# Patient Record
Sex: Female | Born: 1960 | Race: White | Hispanic: No | Marital: Single
Health system: Southern US, Community
[De-identification: ages and names within clinical notes are randomized; demographics above are authoritative.]

## PROBLEM LIST (undated history)

## (undated) DIAGNOSIS — F410 Panic disorder [episodic paroxysmal anxiety] without agoraphobia: Secondary | ICD-10-CM

## (undated) DIAGNOSIS — J4530 Mild persistent asthma, uncomplicated: Secondary | ICD-10-CM

## (undated) DIAGNOSIS — E7212 Methylenetetrahydrofolate reductase deficiency: Secondary | ICD-10-CM

## (undated) DIAGNOSIS — G479 Sleep disorder, unspecified: Secondary | ICD-10-CM

## (undated) DIAGNOSIS — I10 Essential (primary) hypertension: Secondary | ICD-10-CM

## (undated) HISTORY — PX: LIPOSUCTION: SHX10

## (undated) HISTORY — DX: Mild persistent asthma, uncomplicated: J45.30

## (undated) HISTORY — PX: ANAL FISSURECTOMY: SUR608

## (undated) HISTORY — DX: Methylenetetrahydrofolate reductase deficiency: E72.12

## (undated) HISTORY — PX: BREAST REDUCTION SURGERY: SHX8

## (undated) HISTORY — DX: Essential (primary) hypertension: I10

## (undated) HISTORY — DX: Panic disorder (episodic paroxysmal anxiety): F41.0

## (undated) HISTORY — DX: Sleep disorder, unspecified: G47.9

---

## 2016-05-04 DIAGNOSIS — L239 Allergic contact dermatitis, unspecified cause: Secondary | ICD-10-CM | POA: Insufficient documentation

## 2016-05-04 DIAGNOSIS — J4530 Mild persistent asthma, uncomplicated: Secondary | ICD-10-CM | POA: Insufficient documentation

## 2016-05-04 DIAGNOSIS — Z9109 Other allergy status, other than to drugs and biological substances: Secondary | ICD-10-CM | POA: Insufficient documentation

## 2016-05-04 DIAGNOSIS — F410 Panic disorder [episodic paroxysmal anxiety] without agoraphobia: Secondary | ICD-10-CM | POA: Insufficient documentation

## 2016-05-04 DIAGNOSIS — G479 Sleep disorder, unspecified: Secondary | ICD-10-CM | POA: Insufficient documentation

## 2016-05-04 DIAGNOSIS — R002 Palpitations: Secondary | ICD-10-CM | POA: Insufficient documentation

## 2016-05-04 DIAGNOSIS — B359 Dermatophytosis, unspecified: Secondary | ICD-10-CM | POA: Insufficient documentation

## 2016-05-04 HISTORY — DX: Mild persistent asthma, uncomplicated: J45.30

## 2016-05-04 HISTORY — DX: Panic disorder (episodic paroxysmal anxiety): F41.0

## 2016-05-04 HISTORY — DX: Sleep disorder, unspecified: G47.9

## 2017-12-24 DIAGNOSIS — I10 Essential (primary) hypertension: Secondary | ICD-10-CM

## 2017-12-24 HISTORY — DX: Essential (primary) hypertension: I10

## 2018-09-08 ENCOUNTER — Ambulatory Visit (INDEPENDENT_AMBULATORY_CARE_PROVIDER_SITE_OTHER): Payer: BLUE CROSS/BLUE SHIELD

## 2018-09-08 ENCOUNTER — Ambulatory Visit (INDEPENDENT_AMBULATORY_CARE_PROVIDER_SITE_OTHER): Payer: BLUE CROSS/BLUE SHIELD | Admitting: Family Medicine

## 2018-09-08 ENCOUNTER — Encounter: Payer: Self-pay | Admitting: Family Medicine

## 2018-09-08 VITALS — BP 148/75 | HR 92 | Ht 60.0 in | Wt 202.0 lb

## 2018-09-08 DIAGNOSIS — G8929 Other chronic pain: Secondary | ICD-10-CM

## 2018-09-08 DIAGNOSIS — E7212 Methylenetetrahydrofolate reductase deficiency: Secondary | ICD-10-CM | POA: Diagnosis not present

## 2018-09-08 DIAGNOSIS — Z1589 Genetic susceptibility to other disease: Secondary | ICD-10-CM

## 2018-09-08 DIAGNOSIS — M25571 Pain in right ankle and joints of right foot: Secondary | ICD-10-CM | POA: Diagnosis not present

## 2018-09-08 DIAGNOSIS — M25561 Pain in right knee: Secondary | ICD-10-CM

## 2018-09-08 HISTORY — DX: Genetic susceptibility to other disease: Z15.89

## 2018-09-08 MED ORDER — DICLOFENAC SODIUM 1 % TD GEL: 4.0000 g | Freq: Four times a day (QID) | TRANSDERMAL | 11 refills | Status: AC

## 2018-09-08 NOTE — Progress Notes (Signed)
Subjective:    CC: right knee and ankle pain  HPI: Rever notes a several month history of right ankle and knee pain.  She had several falls and has been limping.  She is had some evaluation already.  She is been seen by a podiatrist who obtained x-rays which were reportedly negative.  She is thought to have left lateral ankle tendinitis (likely peroneal).  She has not had home exercise program or physical therapy trial yet.  She has pain is present at the lateral ankle as well as the medial knee.  She denies any giving way or weakness currently.  She denies fevers or chills vomiting or diarrhea.  She is tried some over-the-counter medications which helped a bit.  Additionally she is tried some X 39 life wave patches over-the-counter which helped a bit.  She denies clicking popping catching.  Past medical history, Surgical history, Family history not pertinant except as noted below, Social history, Allergies, and medications have been entered into the medical record, reviewed, and no changes needed.   Review of Systems: No headache, visual changes, nausea, vomiting, diarrhea, constipation, dizziness, abdominal pain, skin rash, fevers, chills, night sweats, weight loss, swollen lymph nodes, body aches, joint swelling, muscle aches, chest pain, shortness of breath, mood changes, visual or auditory hallucinations.   Objective:    Vitals:   09/08/18 1132  BP: (!) 148/75  Pulse: 92   General: Well Developed, well nourished, and in no acute distress.  Neuro/Psych: Alert and oriented x3, extra-ocular muscles intact, able to move all 4 extremities, sensation grossly intact. Skin: Warm and dry, no rashes noted.  Respiratory: Not using accessory muscles, speaking in full sentences, trachea midline.  Cardiovascular: Pulses palpable, no extremity edema. Abdomen: Does not appear distended. MSK:  Right knee normal-appearing without significant effusion or deformity. Range of motion 0-120  degrees with mild retropatellar crepitations. Tender to palpation at medial joint line nontender otherwise. Stable ligamentous exam. Mildly positive medial McMurray's test. Intact flexion and extension strength.  Right ankle normal-appearing without significant deformity. Mildly tender to palpation at the posterior aspect of the lateral malleolus along the course of the peroneal tendon. Foot and ankle motion are intact. Patient has intact strength to foot and ankle motion without significant pain  Antalgic gait present.  Lab and Radiology Results X-ray right knee images personally independently reviewed Minimal degenerative changes at the medial compartment no acute fractures present.   Await formal radiology review  Impression and Recommendations:    Assessment and Plan: 57 y.o. female with  Right knee and ankle pain.  The pain is predominantly at the medial knee and I am concerned that she may have a degenerative meniscus tear.  Fortunately she does not have significant mechanical symptoms.  Plan for trial of conservative management.  Plan for diclofenac gel compression of knee sleeve and referral to physical therapy.  Recheck in 4 to 6 weeks.  Right ankle pain: Likely peroneal tendinitis.  Patient has a history of recent falls and I am worried may have some impaired biomechanics that were likely to be corrected with some physical therapy and gait training.  Again plan for physical therapy, compressive ankle sleeve, and diclofenac gel.  Recheck in 4 to 6 weeks.  Return sooner if needed.  We will send a copy of this note to patient's integrative medicine doctor Dr. Cindy Hazy. Fax  [phone] CC: pcp as well.  Lelan Pons, PA-C  [address]  [place], [place] [address]  [phone]  [phone] (Fax)   Orders Placed This Encounter  Procedures  . DG Knee Complete 4 Views Right    Please include patellar sunrise, lateral, and weightbearing bilateral AP  and bilateral rosenberg views    Standing Status:   Future    Number of Occurrences:   1    Standing Expiration Date:   11/08/2019    Order Specific Question:   Reason for exam:    Answer:   Please include patellar sunrise, lateral, and weightbearing bilateral AP and bilateral rosenberg views    Comments:   Please include patellar sunrise, lateral, and weightbearing bilateral AP and bilateral rosenberg views    Order Specific Question:   Preferred imaging location?    Answer:   [facility]  . DG Knee 1-2 Views Left    Standing Status:   Future    Number of Occurrences:   1    Standing Expiration Date:   11/09/2019    Order Specific Question:   Reason for Exam (SYMPTOM  OR DIAGNOSIS REQUIRED)    Answer:   For use with right knee x-ray, bilateral AP and Rosenberg standing.    Order Specific Question:   Is the patient pregnant?    Answer:   No    Order Specific Question:   Preferred imaging location?    Answer:   [facility]  . Ambulatory referral to Physical Therapy    Referral Priority:   Routine    Referral Type:   Physical Medicine    Referral Reason:   Specialty Services Required    Requested Specialty:   Physical Therapy   Meds ordered this encounter  Medications  . diclofenac sodium (VOLTAREN) 1 % GEL    Sig: Apply 4 g topically 4 (four) times daily. To affected joint.    Dispense:  100 g    Refill:  11    Discussed warning signs or symptoms. Please see discharge instructions. Patient expresses understanding.

## 2018-09-08 NOTE — Patient Instructions (Addendum)
Thank you for coming in today. I recommend body helix full knee and full ankle compression sleeve.   Apply the diclofenac gel up to 4x daily for pain as needed.   Attend PT.   Recheck in 4 weeks.  Return sooner if needed.   We can go faster or be more cautious based on how you are doing.    Meniscus Tear A meniscus tear is a knee injury in which a piece of the meniscus is torn. The meniscus is a thick, rubbery, wedge-shaped cartilage in the knee. Two menisci are located in each knee. They sit between the upper bone (femur) and lower bone (tibia) that make up the knee joint. Each meniscus acts as a shock absorber for the knee. A torn meniscus is one of the most common types of knee injuries. This injury can range from mild to severe. Surgery may be needed for a severe tear. What are the causes? This injury may be caused by any squatting, twisting, or pivoting movement. Sports-related injuries are the most common cause. These often occur from:  Running and stopping suddenly.  Changing direction.  Being tackled or knocked off your feet.  As people get older, their meniscus gets thinner and weaker. In these people, tears can happen more easily, such as from climbing stairs. What increases the risk? This injury is more likely to happen to:  People who play contact sports.  Males.  People who are 55 years of age.  What are the signs or symptoms? Symptoms of this injury include:  Knee pain, especially at the side of the knee joint. You may feel pain when the injury occurs, or you may only hear a pop and feel pain later.  A feeling that your knee is clicking, catching, locking, or giving way.  Not being able to fully bend or extend your knee.  Bruising or swelling in your knee.  How is this diagnosed? This injury may be diagnosed based on your symptoms and a physical exam. The physical exam may include:  Moving your knee in different ways.  Feeling for  tenderness.  Listening for a clicking sound.  Checking if your knee locks or catches.  You may also have tests, such as:  X-rays.  MRI.  A procedure to look inside your knee with a narrow surgical telescope (arthroscopy).  You may be referred to a knee specialist (orthopedic surgeon). How is this treated? Treatment for this injury depends on the severity of the tear. Treatment for a mild tear may include:  Rest.  Medicine to reduce pain and swelling. This is usually a nonsteroidal anti-inflammatory drug (NSAID).  A knee brace or an elastic sleeve or wrap.  Using crutches or a walker to keep weight off your knee and to help you walk.  Exercises to strengthen your knee (physical therapy).  You may need surgery if you have a severe tear or if other treatments are not working. Follow these instructions at home: Managing pain and swelling  Take over-the-counter and prescription medicines only as told by your health care provider.  If directed, apply ice to the injured area: ? Put ice in a plastic bag. ? Place a towel between your skin and the bag. ? Leave the ice on for 20 minutes, 2-3 times per day.  Raise (elevate) the injured area above the level of your heart while you are sitting or lying down. Activity  Do not use the injured limb to support your body weight until your health care  provider says that you can. Use crutches or a walker as told by your health care provider.  Return to your normal activities as told by your health care provider. Ask your health care provider what activities are safe for you.  Perform range-of-motion exercises only as told by your health care provider.  Begin doing exercises to strengthen your knee and leg muscles only as told by your health care provider. After you recover, your health care provider may recommend these exercises to help prevent another injury. General instructions  Use a knee brace or elastic wrap as told by your health  care provider.  Keep all follow-up visits as told by your health care provider. This is important. Contact a health care provider if:  You have a fever.  Your knee becomes red, tender, or swollen.  Your pain medicine is not helping.  Your symptoms get worse or do not improve after 2 weeks of home care. This information is not intended to replace advice given to you by your health care provider. Make sure you discuss any questions you have with your health care provider. Document Released: 02/06/2003 Document Revised: 04/23/2016 Document Reviewed: 03/11/2015 Elsevier Interactive Patient Education  [employer].

## 2018-09-15 ENCOUNTER — Ambulatory Visit (INDEPENDENT_AMBULATORY_CARE_PROVIDER_SITE_OTHER): Payer: BLUE CROSS/BLUE SHIELD | Admitting: Physical Therapy

## 2018-09-15 ENCOUNTER — Encounter: Payer: Self-pay | Admitting: Physical Therapy

## 2018-09-15 DIAGNOSIS — M25561 Pain in right knee: Secondary | ICD-10-CM

## 2018-09-15 DIAGNOSIS — G8929 Other chronic pain: Secondary | ICD-10-CM

## 2018-09-15 DIAGNOSIS — Z9181 History of falling: Secondary | ICD-10-CM

## 2018-09-15 DIAGNOSIS — R2689 Other abnormalities of gait and mobility: Secondary | ICD-10-CM | POA: Diagnosis not present

## 2018-09-15 DIAGNOSIS — M25571 Pain in right ankle and joints of right foot: Secondary | ICD-10-CM | POA: Diagnosis not present

## 2018-09-15 NOTE — Patient Instructions (Signed)
Access Code: [email or web address]  URL: [email or web address]  Date: 09/15/2018  Prepared by: Moshe Cipro   Exercises  Straight Leg Raise with External Rotation - 10 reps - 2 sets - 2-3 sec hold - 2x daily - 7x weekly  Sidelying Hip Abduction - 2 sets - 10 reps - 2x daily - 7x weekly  Seated Ankle Eversion with Resistance - 10 reps - 2 sets - 2x daily - 7x weekly  Standing Heel Raise - 10 reps - 2 sets - 1x daily - 7x weekly  Patient Education  Trigger Point Dry Needling

## 2018-09-16 NOTE — Therapy (Signed)
[facility] [place] [address] [place], [place], [address] Phone: [phone]   Fax:  [phone]  Physical Therapy Evaluation  Patient Details  Name: Laura Duke MRN: [id number] Date of Birth: 10-Aug-1961 Referring Provider (PT): Rodolph Bong, MD   Encounter Date: 09/15/2018  PT End of Session - 09/15/18 1611    Visit Number  1    Number of Visits  12    Date for PT Re-Evaluation  10/27/18    Authorization Type  30 visit limit    PT Start Time  1445    PT Stop Time  1525    PT Time Calculation (min)  40 min    Activity Tolerance  Patient tolerated treatment well    Behavior During Therapy  [facility] for tasks assessed/performed       Past Medical History:  Diagnosis Date  . Essential hypertension 12/24/2017  . Mild persistent asthma 05/04/2016  . MTHFR mutation (HCC) 09/08/2018  . Panic disorder without agoraphobia 05/04/2016  . Sleep disorder 05/04/2016    Past Surgical History:  Procedure Laterality Date  . ANAL FISSURECTOMY    . BREAST REDUCTION SURGERY    . LIPOSUCTION      There were no vitals filed for this visit.   Subjective Assessment - 09/15/18 1449    Subjective  Pt is a 57 y/o female who presents to OPPT for 8-9 falls over past year, resulting in Rt knee and ankle pain.  Pt reports small Rt meniscus tear, and falls most consistent with rolling of ankle.  Pt attributes ankle instability to falls, as well as dealing with grief of mother passing away.    Pertinent History  HTN, panic disorder, depression    Patient Stated Goals  exercises to help with knee and strengthening ankle    Currently in Pain?  Yes    Pain Score  0-No pain   up to 6-7/10   Pain Location  Knee    Pain Orientation  Right;Medial    Pain Descriptors / Indicators  Sharp;Stabbing    Pain Type  Chronic pain;Acute pain    Pain Onset  More than a month ago    Pain Frequency  Intermittent    Aggravating Factors   walking, full extension  when lying down at night    Pain Relieving Factors  sitting    Pain Score  3    Pain Location  Ankle    Pain Orientation  Right;Lateral    Pain Descriptors / Indicators  Constant;Discomfort    Pain Type  Chronic pain;Acute pain    Pain Onset  More than a month ago    Pain Frequency  Constant    Aggravating Factors   walking, rolling it    Pain Relieving Factors  menthol cream, ibuprofen         [facility] PT Assessment - 09/15/18 1455      Assessment   Medical Diagnosis  M25.561 (ICD-10-CM) - Acute pain of right knee; M25.571 (ICD-10-CM) - Acute right ankle pain    Referring Provider (PT)  Rodolph Bong, MD    Onset Date/Surgical Date  --   1 year ago   Next MD Visit  10/12/18    Prior Therapy  n/a      Precautions   Precautions  Fall      Restrictions   Weight Bearing Restrictions  No      Balance Screen   Has the patient fallen in the past 6  months  Yes    How many times?  6-8; most recent 2 months ago    Has the patient had a decrease in activity level because of a fear of falling?   Yes    Is the patient reluctant to leave their home because of a fear of falling?   No      Home Environment   Living Environment  Private residence    Living Arrangements  Alone    Type of Home  House    Home Access  Stairs to enter    Entrance Stairs-Number of Steps  2    Entrance Stairs-Rails  Right;Left;Can reach both    Home Layout  One level      Prior Function   Level of Independence  Independent    Vocation  Full time employment    Water engineer    Leisure  events, fairs, mountains, shopping      Cognition   Overall Cognitive Status  Within Functional Limits for tasks assessed      Observation/Other Assessments   Focus on Therapeutic Outcomes (FOTO)   33 (67% limited; predicted 45% limited)      ROM / Strength   AROM / PROM / Strength  AROM;Strength;PROM      AROM   AROM Assessment Site  Knee;Ankle    Right/Left Knee  Right;Left     Right Knee Extension  0    Right Knee Flexion  122    Left Knee Extension  0    Left Knee Flexion  122    Right/Left Ankle  Right;Left    Right Ankle Dorsiflexion  2    Right Ankle Plantar Flexion  50    Right Ankle Inversion  35    Right Ankle Eversion  12    Left Ankle Dorsiflexion  3    Left Ankle Plantar Flexion  60    Left Ankle Inversion  38    Left Ankle Eversion  15      Strength   Strength Assessment Site  Hip;Knee;Ankle    Right/Left Hip  Right;Left    Right Hip Flexion  4/5    Right Hip ABduction  3/5    Left Hip Flexion  5/5    Left Hip ABduction  5/5    Right/Left Knee  Right;Left    Right Knee Flexion  5/5    Right Knee Extension  4/5   poor VMO activation   Left Knee Flexion  5/5    Left Knee Extension  5/5    Right/Left Ankle  Right;Left    Right Ankle Dorsiflexion  5/5    Right Ankle Inversion  5/5    Right Ankle Eversion  4/5    Left Ankle Dorsiflexion  5/5    Left Ankle Inversion  5/5    Left Ankle Eversion  5/5      Palpation   Palpation comment  tight peroneal tendon, trigger points noted along peroneals      Ambulation/Gait   Gait Comments  amb with Rt foot supination                Objective measurements completed on examination: See above findings.      [facility] Adult PT Treatment/Exercise - 09/15/18 1455      Exercises   Exercises  Knee/Hip;Ankle      Knee/Hip Exercises: Supine   Straight Leg Raise with External Rotation  Right;5 reps      Knee/Hip  Exercises: Sidelying   Hip ABduction  Right;5 reps      Ankle Exercises: Standing   Heel Raises  Both;5 reps    Heel Raises Limitations  focus on RLE use      Ankle Exercises: Seated   Other Seated Ankle Exercises  eversion Rt with red theraband x 5 reps             PT Education - 09/15/18 1610    Education Details  HEP    Person(s) Educated  Patient    Methods  Explanation;Demonstration;Handout    Comprehension  Verbalized understanding;Returned demonstration;Need  further instruction       PT Short Term Goals - 09/16/18 0745      PT SHORT TERM GOAL #1   Title  formal balance testing with LTGs to follow (DGI, BERG)    Status  New    Target Date  09/30/18        PT Long Term Goals - 09/16/18 0746      PT LONG TERM GOAL #1   Title  independent with HEP    Status  New    Target Date  10/28/18      PT LONG TERM GOAL #2   Title  report ability to walk > 20 min without increase in pain for improved function    Status  New    Target Date  10/28/18      PT LONG TERM GOAL #3   Title  FOTO score improved to </= 45% limited for improved function    Status  New    Target Date  10/28/18      PT LONG TERM GOAL #4   Title  RLE strength improved to at least 4+/5 all motions for improved function and stability    Status  New    Target Date  10/28/18             Plan - 09/15/18 1527    Clinical Impression Statement  Pt is a 57 y/o female who presents to OPPT for Rt knee and ankle pain following multiple falls over the past year.  Pt demonstrates decreased strength and trigger points, as well as gait abnormalities affecting functional mobility.  Pt will benefit from PT to address deficits listed.    History and Personal Factors relevant to plan of care:  depression, HTN, asthma    Clinical Presentation  Evolving    Clinical Presentation due to:  multiple falls with unknown cause    Clinical Decision Making  Moderate    Rehab Potential  Good    PT Frequency  2x / week    PT Duration  6 weeks    PT Treatment/Interventions  ADLs/Self Care Home Management;Cryotherapy;Electrical Stimulation;Ultrasound;Moist Heat;Iontophoresis 4mg /ml Dexamethasone;Gait training;Stair training;Functional mobility training;Therapeutic activities;Therapeutic exercise;Patient/family education;Balance training;Neuromuscular re-education;Manual techniques;Taping;Dry needling;Passive range of motion    PT Next Visit Plan  review HEP, DN/manual to peroneals/gastrocs,  modalities PRN, continue per POC    PT Home Exercise Plan  Access Code: [email or web address]       Patient will benefit from skilled therapeutic intervention in order to improve the following deficits and impairments:  Abnormal gait, Increased fascial restricitons, Increased muscle spasms, Pain, Decreased mobility, Decreased balance, Decreased strength, Difficulty walking  Visit Diagnosis: Chronic pain of right knee - Plan: PT plan of care cert/re-cert  Pain in right ankle and joints of right foot - Plan: PT plan of care cert/re-cert  Other abnormalities of gait and mobility - Plan: PT plan of  care cert/re-cert  History of falling - Plan: PT plan of care cert/re-cert     Problem List Patient Active Problem List   Diagnosis Date Noted  . MTHFR mutation (HCC) 09/08/2018  . Essential hypertension 12/24/2017  . Allergic contact dermatitis 05/04/2016  . Dermatophytosis 05/04/2016  . Environmental allergies 05/04/2016  . Heart palpitations 05/04/2016  . Panic disorder without agoraphobia 05/04/2016  . Mild persistent asthma 05/04/2016  . Sleep disorder 05/04/2016      Clarita Crane, PT, DPT 09/16/18 7:50 AM    [facility] [address] [place], [place], [address] Phone: [phone]   Fax:  [phone]  Name: Laura Duke MRN: [id number] Date of Birth: 03-12-1961

## 2018-09-20 ENCOUNTER — Encounter: Payer: Self-pay | Admitting: Rehabilitative and Restorative Service Providers"

## 2018-09-20 ENCOUNTER — Ambulatory Visit (INDEPENDENT_AMBULATORY_CARE_PROVIDER_SITE_OTHER): Payer: BLUE CROSS/BLUE SHIELD | Admitting: Rehabilitative and Restorative Service Providers"

## 2018-09-20 DIAGNOSIS — G8929 Other chronic pain: Secondary | ICD-10-CM

## 2018-09-20 DIAGNOSIS — R2689 Other abnormalities of gait and mobility: Secondary | ICD-10-CM

## 2018-09-20 DIAGNOSIS — Z9181 History of falling: Secondary | ICD-10-CM

## 2018-09-20 DIAGNOSIS — M25561 Pain in right knee: Secondary | ICD-10-CM

## 2018-09-20 DIAGNOSIS — M25571 Pain in right ankle and joints of right foot: Secondary | ICD-10-CM

## 2018-09-20 NOTE — Patient Instructions (Signed)

## 2018-09-20 NOTE — Therapy (Addendum)
[facility] Rehabilitation [place] [address] [place], [place], [address] Phone: [phone]   Fax:  [phone]  Physical Therapy Treatment  Patient Details  Name: Laura Duke MRN: [id number] Date of Birth: 1961/01/28 Referring Provider (PT): Rodolph Bong, MD   Encounter Date: 09/20/2018  PT End of Session - 09/20/18 1030    Visit Number  2    Number of Visits  12    Date for PT Re-Evaluation  10/27/18    Authorization Type  30 visit limit    PT Start Time  1025    PT Stop Time  1117   moist heat post treatment    PT Time Calculation (min)  52 min    Activity Tolerance  Patient tolerated treatment well       Past Medical History:  Diagnosis Date  . Essential hypertension 12/24/2017  . Mild persistent asthma 05/04/2016  . MTHFR mutation (HCC) 09/08/2018  . Panic disorder without agoraphobia 05/04/2016  . Sleep disorder 05/04/2016    Past Surgical History:  Procedure Laterality Date  . ANAL FISSURECTOMY    . BREAST REDUCTION SURGERY    . LIPOSUCTION      There were no vitals filed for this visit.  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 She borrowed a [employer] for some of the walking she had to do. She has some increased soreness today - not sure if that is from the exercises or the walking at [employer].     Currently in Pain?  Yes    Pain Score  5     Pain Location  Knee    Pain Orientation  Right;Medial    Pain Descriptors / Indicators  Sharp;Stabbing    Pain Type  Chronic pain;Acute pain    Pain Onset  More than a month ago    Pain Score  2    Pain Orientation  Right;Lateral    Pain Descriptors / Indicators  Discomfort    Pain Type  Chronic pain;Acute pain    Pain Frequency  Intermittent                       [facility] Adult PT Treatment/Exercise - 09/20/18 0001      Knee/Hip Exercises: Supine    Straight Leg Raise with External Rotation  AROM;Strengthening;Right;10 reps      Knee/Hip Exercises: Sidelying   Hip ABduction  AROM;Strengthening;Right;10 reps   leading with heel - hip extended      Moist Heat Therapy   Number Minutes Moist Heat  15 Minutes    Moist Heat Location  --   posterior calf      Manual Therapy   Manual therapy comments  pt prone    Soft tissue mobilization  Rt gastroc/soleus; peroneals     Myofascial Release  posterior calf       Ankle Exercises: Stretches   Soleus Stretch  --   painful Rt knee - unable to do soleus stretch    Gastroc Stretch  3 reps;30 seconds      Ankle Exercises: Aerobic   Nustep  L4 x 5 min (UE 7)      Ankle Exercises: Seated   Other Seated Ankle Exercises  eversion Rt with red theraband x 10 reps      Ankle Exercises: Standing   Heel Raises  Both;5 reps  Trigger Point Dry Needling - 09/20/18 1059    Consent Given?  Yes    Education Handout Provided  Yes    Muscles Treated Upper Body  --   Rt and Rt peroneals    Gastrocnemius Response  Palpable increased muscle length    Soleus Response  Palpable increased muscle length           PT Education - 09/20/18 1046    Education Details  HEP     Person(s) Educated  Patient    Methods  Explanation    Comprehension  Verbalized understanding       PT Short Term Goals - 09/16/18 0745      PT SHORT TERM GOAL #1   Title  formal balance testing with LTGs to follow (DGI, BERG)    Status  New    Target Date  09/30/18        PT Long Term Goals - 09/16/18 0746      PT LONG TERM GOAL #1   Title  independent with HEP    Status  New    Target Date  10/28/18      PT LONG TERM GOAL #2   Title  report ability to walk > 20 min without increase in pain for improved function    Status  New    Target Date  10/28/18      PT LONG TERM GOAL #3   Title  FOTO score improved to </= 45% limited for improved function    Status  New    Target Date  10/28/18      PT LONG  TERM GOAL #4   Title  RLE strength improved to at least 4+/5 all motions for improved function and stability    Status  New    Target Date  10/28/18            Plan - 09/20/18 1107    Clinical Impression Statement  Patient has increased pain and soreness today likely due to standing and walking at [employer]. Palable tightness noted through the Rt gastroc/soleus and peroneals with some decreased tightness noted following DN and manual work. She tolerated exercise and DN well.     Rehab Potential  Good    PT Frequency  2x / week    PT Duration  6 weeks    PT Treatment/Interventions  ADLs/Self Care Home Management;Cryotherapy;Electrical Stimulation;Ultrasound;Moist Heat;Iontophoresis 4mg /ml Dexamethasone;Gait training;Stair training;Functional mobility training;Therapeutic activities;Therapeutic exercise;Patient/family education;Balance training;Neuromuscular re-education;Manual techniques;Taping;Dry needling;Passive range of motion    PT Next Visit Plan  review HEP, DN/manual to peroneals/gastrocs, modalities PRN, continue per POC    PT Home Exercise Plan  Access Code: [email or web address]    Consulted and Agree with Plan of Care  Patient       Patient will benefit from skilled therapeutic intervention in order to improve the following deficits and impairments:  Abnormal gait, Increased fascial restricitons, Increased muscle spasms, Pain, Decreased mobility, Decreased balance, Decreased strength, Difficulty walking  Visit Diagnosis: Chronic pain of right knee  Pain in right ankle and joints of right foot  Other abnormalities of gait and mobility  History of falling     Problem List Patient Active Problem List   Diagnosis Date Noted  . MTHFR mutation (HCC) 09/08/2018  . Essential hypertension 12/24/2017  . Allergic contact dermatitis 05/04/2016  . Dermatophytosis 05/04/2016  . Environmental allergies 05/04/2016  . Heart palpitations 05/04/2016  . Panic disorder without  agoraphobia 05/04/2016  . Mild persistent asthma 05/04/2016  .  Sleep disorder 05/04/2016    Jonnatan Hanners Rober Minion PT, MPH  09/20/2018, 11:13 AM  [facility] [address] [place], [place], [address] Phone: [phone]   Fax:  [phone]  Name: Deanette Tullius MRN: [id number] Date of Birth: 30-Jan-1961

## 2018-09-22 ENCOUNTER — Ambulatory Visit (INDEPENDENT_AMBULATORY_CARE_PROVIDER_SITE_OTHER): Payer: BLUE CROSS/BLUE SHIELD | Admitting: Physical Therapy

## 2018-09-22 ENCOUNTER — Encounter: Payer: Self-pay | Admitting: Physical Therapy

## 2018-09-22 DIAGNOSIS — R2689 Other abnormalities of gait and mobility: Secondary | ICD-10-CM

## 2018-09-22 DIAGNOSIS — Z9181 History of falling: Secondary | ICD-10-CM | POA: Diagnosis not present

## 2018-09-22 DIAGNOSIS — G8929 Other chronic pain: Secondary | ICD-10-CM

## 2018-09-22 DIAGNOSIS — M25561 Pain in right knee: Secondary | ICD-10-CM

## 2018-09-22 DIAGNOSIS — M25571 Pain in right ankle and joints of right foot: Secondary | ICD-10-CM | POA: Diagnosis not present

## 2018-09-22 NOTE — Therapy (Signed)
[facility] Outpatient Rehabilitation [place] [address] [place] [address] [place], [place], [address] Phone: [phone]   Fax:  [phone]  Physical Therapy Treatment  Patient Details  Name: Laura Duke MRN: [id number] Date of Birth: 02/20/1961 Referring Provider (PT): Rodolph Bong, MD   Encounter Date: 09/22/2018  PT End of Session - 09/22/18 1251    Visit Number  3    Number of Visits  12    Date for PT Re-Evaluation  10/27/18    Authorization Type  30 visit limit    PT Start Time  1206    PT Stop Time  1246    PT Time Calculation (min)  40 min    Activity Tolerance  Patient tolerated treatment well    Behavior During Therapy  [facility] for tasks assessed/performed       Past Medical History:  Diagnosis Date  . Essential hypertension 12/24/2017  . Mild persistent asthma 05/04/2016  . MTHFR mutation (HCC) 09/08/2018  . Panic disorder without agoraphobia 05/04/2016  . Sleep disorder 05/04/2016    Past Surgical History:  Procedure Laterality Date  . ANAL FISSURECTOMY    . BREAST REDUCTION SURGERY    . LIPOSUCTION      There were no vitals filed for this visit.  Subjective Assessment - 09/22/18 1208    Subjective  after last session went to chiropractor then had 4 hours of no pain.  sore from dry needling but felt it was very helpful (no DN today, but wants to consider for next week)    Patient Stated Goals  exercises to help with knee and strengthening ankle    Currently in Pain?  Yes    Pain Score  0-No pain   up to 7/10 with standing   Pain Location  Knee    Pain Orientation  Right;Medial    Pain Onset  More than a month ago    Aggravating Factors   standing, walking    Pain Relieving Factors  sitting    Pain Score  0         [facility] PT Assessment - 09/22/18 1210      Assessment   Medical Diagnosis  M25.561 (ICD-10-CM) - Acute pain of right knee; M25.571 (ICD-10-CM) - Acute right ankle pain    Referring Provider (PT)  Rodolph Bong, MD    Next MD  Visit  10/12/18                   [facility] Adult PT Treatment/Exercise - 09/22/18 1210      Exercises   Exercises  Knee/Hip;Ankle      Knee/Hip Exercises: Stretches   Passive Hamstring Stretch  Right;3 reps;30 seconds    Passive Hamstring Stretch Limitations  supine with strap    ITB Stretch  Right;3 reps;30 seconds    ITB Stretch Limitations  supine with strap      Knee/Hip Exercises: Seated   Long Arc Quad  Right;2 sets;10 reps;Weights    Long Arc Quad Weight  2 lbs.    Long [employer] Limitations  with ball squeeze      Knee/Hip Exercises: Supine   Bridges  2 sets;10 reps    Straight Leg Raise with External Rotation  AROM;Strengthening;Right;10 reps;2 sets    Straight Leg Raise with External Rotation Limitations  2#      Ankle Exercises: Aerobic   Nustep  L5 x 6 min (UE 7)   PT present to discuss progress     Ankle  Exercises: Stretches   Soleus Stretch  3 reps;30 seconds   seated   Gastroc Stretch  3 reps;30 seconds      Ankle Exercises: Seated   Other Seated Ankle Exercises  ankle 4 way with red theraband x 20 reps               PT Short Term Goals - 09/16/18 0745      PT SHORT TERM GOAL #1   Title  formal balance testing with LTGs to follow (DGI, BERG)    Status  New    Target Date  09/30/18        PT Long Term Goals - 09/16/18 0746      PT LONG TERM GOAL #1   Title  independent with HEP    Status  New    Target Date  10/28/18      PT LONG TERM GOAL #2   Title  report ability to walk > 20 min without increase in pain for improved function    Status  New    Target Date  10/28/18      PT LONG TERM GOAL #3   Title  FOTO score improved to </= 45% limited for improved function    Status  New    Target Date  10/28/18      PT LONG TERM GOAL #4   Title  RLE strength improved to at least 4+/5 all motions for improved function and stability    Status  New    Target Date  10/28/18            Plan - 09/22/18 1251    Clinical  Impression Statement  Pt tolerated session well today without increase in pain.  Session focused on strengthening and stretching today.  Will continue to benefit from PT to maximize function.    Rehab Potential  Good    PT Frequency  2x / week    PT Duration  6 weeks    PT Treatment/Interventions  ADLs/Self Care Home Management;Cryotherapy;Electrical Stimulation;Ultrasound;Moist Heat;Iontophoresis 4mg /ml Dexamethasone;Gait training;Stair training;Functional mobility training;Therapeutic activities;Therapeutic exercise;Patient/family education;Balance training;Neuromuscular re-education;Manual techniques;Taping;Dry needling;Passive range of motion    PT Next Visit Plan  review HEP, DN/manual to peroneals/gastrocs, modalities PRN, continue per POC    PT Home Exercise Plan  Access Code: [email or web address]    Consulted and Agree with Plan of Care  Patient       Patient will benefit from skilled therapeutic intervention in order to improve the following deficits and impairments:  Abnormal gait, Increased fascial restricitons, Increased muscle spasms, Pain, Decreased mobility, Decreased balance, Decreased strength, Difficulty walking  Visit Diagnosis: Chronic pain of right knee  Pain in right ankle and joints of right foot  Other abnormalities of gait and mobility  History of falling     Problem List Patient Active Problem List   Diagnosis Date Noted  . MTHFR mutation (HCC) 09/08/2018  . Essential hypertension 12/24/2017  . Allergic contact dermatitis 05/04/2016  . Dermatophytosis 05/04/2016  . Environmental allergies 05/04/2016  . Heart palpitations 05/04/2016  . Panic disorder without agoraphobia 05/04/2016  . Mild persistent asthma 05/04/2016  . Sleep disorder 05/04/2016      Clarita Crane, PT, DPT 09/22/18 12:55 PM    [facility] [address] [place] [address] [place], [place], [address] Phone: [phone]   Fax:   [phone]  Name: Zain Bingman MRN: [id number] Date of Birth: 01-Jul-1961

## 2018-09-27 ENCOUNTER — Encounter: Payer: Self-pay | Admitting: Physical Therapy

## 2018-09-27 ENCOUNTER — Ambulatory Visit (INDEPENDENT_AMBULATORY_CARE_PROVIDER_SITE_OTHER): Payer: BLUE CROSS/BLUE SHIELD | Admitting: Physical Therapy

## 2018-09-27 DIAGNOSIS — G8929 Other chronic pain: Secondary | ICD-10-CM

## 2018-09-27 DIAGNOSIS — M25571 Pain in right ankle and joints of right foot: Secondary | ICD-10-CM | POA: Diagnosis not present

## 2018-09-27 DIAGNOSIS — R2689 Other abnormalities of gait and mobility: Secondary | ICD-10-CM | POA: Diagnosis not present

## 2018-09-27 DIAGNOSIS — Z9181 History of falling: Secondary | ICD-10-CM | POA: Diagnosis not present

## 2018-09-27 DIAGNOSIS — M25561 Pain in right knee: Secondary | ICD-10-CM

## 2018-09-27 NOTE — Therapy (Signed)
[facility] Outpatient Rehabilitation [place] [address] [address] [place], [place], [address] Phone: [phone]   Fax:  [phone]  Physical Therapy Treatment  Patient Details  Name: Laura Duke MRN: [id number] Date of Birth: 1961/03/16 Referring Provider (PT): Rodolph Bong, MD   Encounter Date: 09/27/2018  PT End of Session - 09/27/18 1258    Visit Number  4    Number of Visits  12    Date for PT Re-Evaluation  10/27/18    Authorization Type  30 visit limit    PT Start Time  1214   pt arrived late   PT Stop Time  1245    PT Time Calculation (min)  31 min    Activity Tolerance  Patient tolerated treatment well    Behavior During Therapy  [facility] for tasks assessed/performed       Past Medical History:  Diagnosis Date  . Essential hypertension 12/24/2017  . Mild persistent asthma 05/04/2016  . MTHFR mutation (HCC) 09/08/2018  . Panic disorder without agoraphobia 05/04/2016  . Sleep disorder 05/04/2016    Past Surgical History:  Procedure Laterality Date  . ANAL FISSURECTOMY    . BREAST REDUCTION SURGERY    . LIPOSUCTION      There were no vitals filed for this visit.  Subjective Assessment - 09/27/18 1216    Subjective  doing well; still a little sore but wants to try DN again today.    Patient Stated Goals  exercises to help with knee and strengthening ankle    Pain Score  0-No pain    Pain Onset  More than a month ago    Pain Score  0                       [facility] Adult PT Treatment/Exercise - 09/27/18 1217      Knee/Hip Exercises: Stretches   Passive Hamstring Stretch  Right;3 reps;30 seconds    Passive Hamstring Stretch Limitations  supine with strap; with knee bent      Manual Therapy   Manual therapy comments  pt prone; skilled palpation and monitoring of soft tissue during DN    Soft tissue mobilization  Rt gastroc/soleus; peroneals; Rt hamstrings      Ankle Exercises: Aerobic   Nustep  L5 x 6 min (UE 7)        Trigger Point Dry Needling - 09/27/18 1256    Consent Given?  Yes    Muscles Treated Lower Body  Gastrocnemius;Soleus;Hamstring    Hamstring Response  Twitch response elicited;Palpable increased muscle length    Gastrocnemius Response  Twitch response elicited;Palpable increased muscle length    Soleus Response  Twitch response elicited;Palpable increased muscle length   and peroneals            PT Short Term Goals - 09/16/18 0745      PT SHORT TERM GOAL #1   Title  formal balance testing with LTGs to follow (DGI, BERG)    Status  New    Target Date  09/30/18        PT Long Term Goals - 09/16/18 0746      PT LONG TERM GOAL #1   Title  independent with HEP    Status  New    Target Date  10/28/18      PT LONG TERM GOAL #2   Title  report ability to walk > 20 min without increase in pain for improved function  Status  New    Target Date  10/28/18      PT LONG TERM GOAL #3   Title  FOTO score improved to </= 45% limited for improved function    Status  New    Target Date  10/28/18      PT LONG TERM GOAL #4   Title  RLE strength improved to at least 4+/5 all motions for improved function and stability    Status  New    Target Date  10/28/18            Plan - 09/27/18 1258    Clinical Impression Statement  Pt tolerated DN and manual therapy well today with decrease in tightness following session today.  Pt will continue to benefit from PT to maximize function.  Pt arrived late to session today so will perform formal balance testing next session.    Rehab Potential  Good    PT Frequency  2x / week    PT Duration  6 weeks    PT Treatment/Interventions  ADLs/Self Care Home Management;Cryotherapy;Electrical Stimulation;Ultrasound;Moist Heat;Iontophoresis 4mg /ml Dexamethasone;Gait training;Stair training;Functional mobility training;Therapeutic activities;Therapeutic exercise;Patient/family education;Balance training;Neuromuscular re-education;Manual  techniques;Taping;Dry needling;Passive range of motion    PT Next Visit Plan  review HEP, DN/manual to peroneals/gastrocs, modalities PRN, continue per POC; perform BERG and FGA    PT Home Exercise Plan  Access Code: [email or web address]    Consulted and Agree with Plan of Care  Patient       Patient will benefit from skilled therapeutic intervention in order to improve the following deficits and impairments:  Abnormal gait, Increased fascial restricitons, Increased muscle spasms, Pain, Decreased mobility, Decreased balance, Decreased strength, Difficulty walking  Visit Diagnosis: Chronic pain of right knee  Pain in right ankle and joints of right foot  Other abnormalities of gait and mobility  History of falling     Problem List Patient Active Problem List   Diagnosis Date Noted  . MTHFR mutation (HCC) 09/08/2018  . Essential hypertension 12/24/2017  . Allergic contact dermatitis 05/04/2016  . Dermatophytosis 05/04/2016  . Environmental allergies 05/04/2016  . Heart palpitations 05/04/2016  . Panic disorder without agoraphobia 05/04/2016  . Mild persistent asthma 05/04/2016  . Sleep disorder 05/04/2016      Clarita Crane, PT, DPT 09/27/18 1:07 PM     [facility] [address] [place], [place], [address] Phone: [phone]   Fax:  [phone]  Name: Jermya Dowding MRN: [id number] Date of Birth: 08-Feb-1961

## 2018-09-29 ENCOUNTER — Ambulatory Visit (INDEPENDENT_AMBULATORY_CARE_PROVIDER_SITE_OTHER): Payer: BLUE CROSS/BLUE SHIELD | Admitting: Physical Therapy

## 2018-09-29 DIAGNOSIS — R2689 Other abnormalities of gait and mobility: Secondary | ICD-10-CM

## 2018-09-29 DIAGNOSIS — M25561 Pain in right knee: Secondary | ICD-10-CM

## 2018-09-29 DIAGNOSIS — G8929 Other chronic pain: Secondary | ICD-10-CM

## 2018-09-29 DIAGNOSIS — Z9181 History of falling: Secondary | ICD-10-CM

## 2018-09-29 DIAGNOSIS — M25571 Pain in right ankle and joints of right foot: Secondary | ICD-10-CM | POA: Diagnosis not present

## 2018-09-29 NOTE — Therapy (Signed)
[facility] Outpatient Rehabilitation [place] [address] [place], [place], [address] Phone: [phone]   Fax:  [phone]  Physical Therapy Treatment  Patient Details  Name: Laura Duke MRN: [id number] Date of Birth: 1961-10-07 Referring Provider (PT): Rodolph Bong, MD   Encounter Date: 09/29/2018  PT End of Session - 09/29/18 1458    Visit Number  5    Number of Visits  12    Date for PT Re-Evaluation  10/27/18    Authorization Type  30 visit limit    PT Start Time  1455   pt arrived late   PT Stop Time  1532    PT Time Calculation (min)  37 min    Activity Tolerance  Patient tolerated treatment well;No increased pain    Behavior During Therapy  [facility] for tasks assessed/performed       Past Medical History:  Diagnosis Date  . Essential hypertension 12/24/2017  . Mild persistent asthma 05/04/2016  . MTHFR mutation (HCC) 09/08/2018  . Panic disorder without agoraphobia 05/04/2016  . Sleep disorder 05/04/2016    Past Surgical History:  Procedure Laterality Date  . ANAL FISSURECTOMY    . BREAST REDUCTION SURGERY    . LIPOSUCTION      There were no vitals filed for this visit.  Subjective Assessment - 09/29/18 1456    Subjective  Pt reports her calf throbbed for 24 hours after DN last session. Ankle has been a lot better.  No recent falls.      Patient Stated Goals  exercises to help with knee and strengthening ankle    Currently in Pain?  Yes    Pain Score  4     Pain Location  Knee    Pain Orientation  Right;Medial    Pain Descriptors / Indicators  Sharp    Aggravating Factors   standing, dangling her leg, first thing in morning     Pain Relieving Factors  sitting, getting moving.          [facility] PT Assessment - 09/29/18 0001      Assessment   Medical Diagnosis  M25.561 (ICD-10-CM) - Acute pain of right knee; M25.571 (ICD-10-CM) - Acute right ankle pain    Referring Provider (PT)  Rodolph Bong, MD    Next MD Visit  10/12/18       Standardized Balance Assessment   Standardized Balance Assessment  Dynamic Gait Index      Dynamic Gait Index   Level Surface  Normal    Change in Gait Speed  Mild Impairment    Gait with Horizontal Head Turns  Normal    Gait with Vertical Head Turns  Normal    Gait and Pivot Turn  Mild Impairment   3 small steps to turn   Step Over Obstacle  Mild Impairment   slows speed to step over   Step Around Obstacles  Normal    Steps  Mild Impairment    Total Score  20       [facility] Adult PT Treatment/Exercise - 09/29/18 0001      Exercises   Exercises  Knee/Hip      Knee/Hip Exercises: Stretches   Passive Hamstring Stretch  Right;30 seconds;4 reps   supine with strap   Passive Hamstring Stretch Limitations  various angles to stretch lateral and medial hamstring    Gastroc Stretch  Both;2 reps;30 seconds   slant board, level 3   Soleus Stretch  Both;2 reps;30 seconds  slant board, level 2   Other Knee/Hip Stretches  supine adductor stretch x 30 sec x 2 reps (supine with strap)      Knee/Hip Exercises: Standing   Stairs  reciprocal pattern with min to no UE support x 5 steps (challenging descending 6" step)    Gait Training  160 ft around gym with cues for increased Rt heel strike and toe off; improved with cues.       Modalities   Modalities  --   pt declined     Manual Therapy   Manual Therapy  Taping    Manual therapy comments  I strip of sensitive skin rock tape applied with 20% stretch over Rt pes anserine, perpendicular strip applied on top with 50% stretch     Soft tissue mobilization  STM and IASTM with Edge tool to Rt medial hamstring and mid-distal adductors to decrease fascial tightness       Ankle Exercises: Aerobic   Nustep  L5 x 6 min (UE 8)               PT Short Term Goals - 09/29/18 1615      PT SHORT TERM GOAL #1   Title  formal balance testing with LTGs to follow (DGI, BERG)    Status  Achieved        PT Long Term Goals - 09/16/18 0746       PT LONG TERM GOAL #1   Title  independent with HEP    Status  New    Target Date  10/28/18      PT LONG TERM GOAL #2   Title  report ability to walk > 20 min without increase in pain for improved function    Status  New    Target Date  10/28/18      PT LONG TERM GOAL #3   Title  FOTO score improved to </= 45% limited for improved function    Status  New    Target Date  10/28/18      PT LONG TERM GOAL #4   Title  RLE strength improved to at least 4+/5 all motions for improved function and stability    Status  New    Target Date  10/28/18            Plan - 09/29/18 1609    Clinical Impression Statement  Pt scored 20 on DGI; minor deficits with stepping over obstacle and stairs. Pt had some tightness in her Rt adductor/ medial hamstring; decreased with manual therapy to area.  Trial of sensitive skin Rock tape applied to Rt pes anserine for pain relief.  May benefit from ionto patch to area in future.  Progressing towards goals.    Rehab Potential  Good    PT Frequency  2x / week    PT Duration  6 weeks    PT Treatment/Interventions  ADLs/Self Care Home Management;Cryotherapy;Electrical Stimulation;Ultrasound;Moist Heat;Iontophoresis 4mg /ml Dexamethasone;Gait training;Stair training;Functional mobility training;Therapeutic activities;Therapeutic exercise;Patient/family education;Balance training;Neuromuscular re-education;Manual techniques;Taping;Dry needling;Passive range of motion    PT Next Visit Plan  continue per POC.     Consulted and Agree with Plan of Care  Patient       Patient will benefit from skilled therapeutic intervention in order to improve the following deficits and impairments:  Abnormal gait, Increased fascial restricitons, Increased muscle spasms, Pain, Decreased mobility, Decreased balance, Decreased strength, Difficulty walking  Visit Diagnosis: Chronic pain of right knee  Pain in right ankle and joints of  right foot  Other abnormalities of gait and  mobility  History of falling     Problem List Patient Active Problem List   Diagnosis Date Noted  . MTHFR mutation (HCC) 09/08/2018  . Essential hypertension 12/24/2017  . Allergic contact dermatitis 05/04/2016  . Dermatophytosis 05/04/2016  . Environmental allergies 05/04/2016  . Heart palpitations 05/04/2016  . Panic disorder without agoraphobia 05/04/2016  . Mild persistent asthma 05/04/2016  . Sleep disorder 05/04/2016   Mayer Camel, PTA 09/29/18 4:18 PM  [facility] Out[facility] Rehabilitation [place] [address] [place] [address] [place], [place], [address] Phone: [phone]   Fax:  [phone]  Name: Laura Duke MRN: [id number] Date of Birth: 08/10/1961

## 2018-10-04 ENCOUNTER — Ambulatory Visit (INDEPENDENT_AMBULATORY_CARE_PROVIDER_SITE_OTHER): Payer: BLUE CROSS/BLUE SHIELD | Admitting: Physical Therapy

## 2018-10-04 DIAGNOSIS — G8929 Other chronic pain: Secondary | ICD-10-CM

## 2018-10-04 DIAGNOSIS — M25561 Pain in right knee: Secondary | ICD-10-CM

## 2018-10-04 DIAGNOSIS — Z9181 History of falling: Secondary | ICD-10-CM | POA: Diagnosis not present

## 2018-10-04 DIAGNOSIS — M25571 Pain in right ankle and joints of right foot: Secondary | ICD-10-CM | POA: Diagnosis not present

## 2018-10-04 DIAGNOSIS — R2689 Other abnormalities of gait and mobility: Secondary | ICD-10-CM | POA: Diagnosis not present

## 2018-10-04 NOTE — Therapy (Signed)
[facility] [place] [address], [place], [address] Phone: [phone]   Fax:  [phone]  Physical Therapy Treatment  Patient Details  Name: Laura Duke MRN: [id number] Date of Birth: 1961/10/22 Referring Provider (PT): Rodolph Bong, MD   Encounter Date: 10/04/2018  PT End of Session - 10/04/18 1159    Visit Number  6    Number of Visits  12    Date for PT Re-Evaluation  10/27/18    Authorization Type  30 visit limit    PT Start Time  1153   pt arrived late   PT Stop Time  1235    PT Time Calculation (min)  42 min    Activity Tolerance  Patient tolerated treatment well    Behavior During Therapy  [facility] for tasks assessed/performed       Past Medical History:  Diagnosis Date  . Essential hypertension 12/24/2017  . Mild persistent asthma 05/04/2016  . MTHFR mutation (HCC) 09/08/2018  . Panic disorder without agoraphobia 05/04/2016  . Sleep disorder 05/04/2016    Past Surgical History:  Procedure Laterality Date  . ANAL FISSURECTOMY    . BREAST REDUCTION SURGERY    . LIPOSUCTION      There were no vitals filed for this visit.  Subjective Assessment - 10/04/18 1156    Subjective  Pt reports she was putting all weight on LLE to decrease pain in Rt knee.  This has made "the tissue of my Lt heel very tender"; "Like a stone bruise".   She liked the tape on her Rt knee; it lasted 3 days and decreased the pain.     Currently in Pain?  Yes    Pain Score  6     Pain Location  Knee    Pain Orientation  Right    Pain Descriptors / Indicators  Dull;Sharp    Aggravating Factors   standing     Pain Relieving Factors  leg elevated and propped a certain way.     Multiple Pain Sites  Yes    Pain Location  Heel    Pain Orientation  Left    Pain Descriptors / Indicators  Tender    Aggravating Factors   weight bearing     Pain Relieving Factors  non weight bearing.          [facility] PT Assessment - 10/04/18 0001      Assessment   Medical Diagnosis  M25.561 (ICD-10-CM) - Acute pain of right knee; M25.571 (ICD-10-CM) - Acute right ankle pain    Referring Provider (PT)  Rodolph Bong, MD    Next MD Visit  10/12/18       [facility] Adult PT Treatment/Exercise - 10/04/18 0001      Knee/Hip Exercises: Stretches   Passive Hamstring Stretch  Right;Left;2 reps;30 seconds   supine with strap   Gastroc Stretch  Both;2 reps;30 seconds   slant board, level 3   Soleus Stretch  Both;2 reps;30 seconds   slant board, level 2   Other Knee/Hip Stretches  supine adductor stretch x 30 sec x 2 reps (supine with strap) - each leg    Trial in standing holding onto railing; preferred supine with strap.     Manual Therapy   Manual Therapy  Taping    Manual therapy comments  I strip of sensitive skin rock tape applied with 20% stretch over Rt pes anserine, perpendicular strip applied on top with 50% stretch;   reg rock  tape applied to Lt plantar fascia and perpendicular strip applied to plantar fascia    Soft tissue mobilization  STM  to Rt medial hamstring and mid-distal adductors, Rt peroneals -  to decrease fascial tightness    Myofascial Release  Rt adductor (distal to mid)      Ankle Exercises: Aerobic   Nustep  L5 x 6 min (UE 8)      Ankle Exercises: Stretches   Plantar Fascia Stretch  1 rep;30 seconds    Gastroc Stretch  --      Ankle Exercises: Seated   Other Seated Ankle Exercises  Rt ankle eversion, inversion and DF with green band x 10; Lt ankle DF with green band x 10                PT Short Term Goals - 09/29/18 1615      PT SHORT TERM GOAL #1   Title  formal balance testing with LTGs to follow (DGI, BERG)    Status  Achieved        PT Long Term Goals - 09/16/18 0746      PT LONG TERM GOAL #1   Title  independent with HEP    Status  New    Target Date  10/28/18      PT LONG TERM GOAL #2   Title  report ability to walk > 20 min without increase in pain for improved function    Status   New    Target Date  10/28/18      PT LONG TERM GOAL #3   Title  FOTO score improved to </= 45% limited for improved function    Status  New    Target Date  10/28/18      PT LONG TERM GOAL #4   Title  RLE strength improved to at least 4+/5 all motions for improved function and stability    Status  New    Target Date  10/28/18            Plan - 10/04/18 1208    Clinical Impression Statement  Pt had minor bruising in Lt adductor and hamstring where IASTM was performed;  STM performed today without Edge tool.  She had positive response to Rock tape application; reapplied to pes anserine.  She was able to tolerate increased resistance with Rt ankle exercises.  Pt making gains towards goals with each visit.     Rehab Potential  Good    PT Frequency  2x / week    PT Duration  6 weeks    PT Treatment/Interventions  ADLs/Self Care Home Management;Cryotherapy;Electrical Stimulation;Ultrasound;Moist Heat;Iontophoresis 4mg /ml Dexamethasone;Gait training;Stair training;Functional mobility training;Therapeutic activities;Therapeutic exercise;Patient/family education;Balance training;Neuromuscular re-education;Manual techniques;Taping;Dry needling;Passive range of motion    PT Next Visit Plan  con    Consulted and Agree with Plan of Care  Patient       Patient will benefit from skilled therapeutic intervention in order to improve the following deficits and impairments:  Abnormal gait, Increased fascial restricitons, Increased muscle spasms, Pain, Decreased mobility, Decreased balance, Decreased strength, Difficulty walking  Visit Diagnosis: Chronic pain of right knee  Other abnormalities of gait and mobility  History of falling  Pain in right ankle and joints of right foot     Problem List Patient Active Problem List   Diagnosis Date Noted  . MTHFR mutation (HCC) 09/08/2018  . Essential hypertension 12/24/2017  . Allergic contact dermatitis 05/04/2016  . Dermatophytosis 05/04/2016   . Environmental allergies 05/04/2016  .  Heart palpitations 05/04/2016  . Panic disorder without agoraphobia 05/04/2016  . Mild persistent asthma 05/04/2016  . Sleep disorder 05/04/2016   Mayer Camel, PTA 10/04/18 12:55 PM  [facility] [place] [address] [place], [place], [address] Phone: [phone]   Fax:  [phone]  Name: Laura Duke MRN: [id number] Date of Birth: March 04, 1961

## 2018-10-06 ENCOUNTER — Ambulatory Visit (INDEPENDENT_AMBULATORY_CARE_PROVIDER_SITE_OTHER): Payer: BLUE CROSS/BLUE SHIELD | Admitting: Physical Therapy

## 2018-10-06 DIAGNOSIS — M25571 Pain in right ankle and joints of right foot: Secondary | ICD-10-CM

## 2018-10-06 DIAGNOSIS — G8929 Other chronic pain: Secondary | ICD-10-CM

## 2018-10-06 DIAGNOSIS — Z9181 History of falling: Secondary | ICD-10-CM | POA: Diagnosis not present

## 2018-10-06 DIAGNOSIS — R2689 Other abnormalities of gait and mobility: Secondary | ICD-10-CM

## 2018-10-06 DIAGNOSIS — M25561 Pain in right knee: Secondary | ICD-10-CM

## 2018-10-06 NOTE — Therapy (Signed)
[place] [address] [place], [place], [address] Phone: [phone]   Fax:  [phone]  Physical Therapy Treatment  Patient Details  Name: Laura Duke MRN: [id number] Date of Birth: 1961-07-27 Referring Provider (PT): Gregor Hams, MD   Encounter Date: 10/06/2018  PT End of Session - 10/06/18 1457    Visit Number  7    Number of Visits  12    Date for PT Re-Evaluation  10/27/18    Authorization Type  30 visit limit    PT Start Time  5366   pt arrived late   PT Stop Time  1531    PT Time Calculation (min)  38 min    Activity Tolerance  Patient tolerated treatment well;No increased pain    Behavior During Therapy  WFL for tasks assessed/performed       Past Medical History:  Diagnosis Date  . Essential hypertension 12/24/2017  . Mild persistent asthma 05/04/2016  . MTHFR mutation ([place]) 09/08/2018  . Panic disorder without agoraphobia 05/04/2016  . Sleep disorder 05/04/2016    Past Surgical History:  Procedure Laterality Date  . ANAL FISSURECTOMY    . BREAST REDUCTION SURGERY    . LIPOSUCTION      There were no vitals filed for this visit.  Subjective Assessment - 10/06/18 1457    Subjective  Pt reports the tape on bottom of Lt foot, "worked fantastic.  the pain is down by 70%".    I rested a lot yesterday, with pillows under the knees.      Currently in Pain?  Yes    Pain Score  3     Pain Location  Heel    Pain Orientation  Left    Pain Descriptors / Indicators  Aching    Aggravating Factors   standing    Pain Relieving Factors  not standing          [facility] PT Assessment - 10/06/18 0001      Assessment   Medical Diagnosis  d    Referring Provider (PT)  Gregor Hams, MD    Next MD Visit  10/12/18      Strength   Right/Left Hip  Right    Right Hip Flexion  --   5-/5   Right Hip Extension  4+/5    Right Hip ABduction  --   5-/5   Right Knee Extension  5/5    Right Ankle Eversion  4+/5        [facility] Adult PT Treatment/Exercise - 10/06/18 0001      Exercises   Exercises  Knee/Hip;Ankle      Knee/Hip Exercises: Stretches   ITB Stretch  Right;2 reps;30 seconds   LLE 1 rep   Gastroc Stretch  Both;2 reps;60 seconds   slant board, level 3   Soleus Stretch  Both;2 reps;30 seconds   slant board, level 2   Other Knee/Hip Stretches  supine adductor stretch x 30 sec x 2 reps (supine with strap) - each leg     Other Knee/Hip Stretches  Rt fibularis muscle stretch (prior to ITB stretch), in supine x 30 sec x 2      Knee/Hip Exercises: Prone   Hip Extension  Strengthening;Right;1 set;10 reps      Manual Therapy   Manual Therapy  Taping    Manual therapy comments  I strip of sensitive skin rock tape applied with 20% stretch over Rt pes anserine, perpendicular strip applied on top  with 50% stretch;   reg rock tape applied to Lt plantar fascia and perpendicular strip applied to plantar fascia;  I strip of sensitive Rock tape applied to Rt fibularis muscle group with perpendicular strips just above lateral mallleoli     Soft tissue mobilization  STM and IASTM to Rt fibularis muscle group to decrease pain, fascial restrictions and improve mobility; STM to Rt pes anserine prior to tape application      Ankle Exercises: Aerobic   Nustep  L5 x 5 min (UE 8)               PT Short Term Goals - 09/29/18 1615      PT SHORT TERM GOAL #1   Title  formal balance testing with LTGs to follow (DGI, BERG)    Status  Achieved        PT Long Term Goals - 10/06/18 1502      PT LONG TERM GOAL #1   Title  independent with HEP    Status  On-going      PT LONG TERM GOAL #2   Title  report ability to walk > 20 min without increase in pain for improved function    Baseline  10/06/18: 10 min     Status  On-going      PT LONG TERM GOAL #3   Title  FOTO score improved to </= 45% limited for improved function    Status  On-going      PT LONG TERM GOAL #4   Title  RLE strength  improved to at least 4+/5 all motions for improved function and stability    Status  Achieved            Plan - 10/06/18 1600    Clinical Impression Statement  Pt demonstrated improved RLE strength compared to eval; has met LTG #4.  Pt has had positive response to STM and kinesiotape application; repeated today with addition for Rt fibularis group where fascial tightness was noted.  All exercises were tolerated well, without increase in pain.     Rehab Potential  Good    PT Frequency  2x / week    PT Duration  6 weeks    PT Treatment/Interventions  ADLs/Self Care Home Management;Cryotherapy;Electrical Stimulation;Ultrasound;Moist Heat;Iontophoresis 40m/ml Dexamethasone;Gait training;Stair training;Functional mobility training;Therapeutic activities;Therapeutic exercise;Patient/family education;Balance training;Neuromuscular re-education;Manual techniques;Taping;Dry needling;Passive range of motion    PT Next Visit Plan  continue progressive LE strengthening and stretching to build walking tolerance.      Consulted and Agree with Plan of Care  Patient       Patient will benefit from skilled therapeutic intervention in order to improve the following deficits and impairments:  Abnormal gait, Increased fascial restricitons, Increased muscle spasms, Pain, Decreased mobility, Decreased balance, Decreased strength, Difficulty walking  Visit Diagnosis: Chronic pain of right knee  Other abnormalities of gait and mobility  History of falling  Pain in right ankle and joints of right foot     Problem List Patient Active Problem List   Diagnosis Date Noted  . MTHFR mutation ([place] 09/08/2018  . Essential hypertension 12/24/2017  . Allergic contact dermatitis 05/04/2016  . Dermatophytosis 05/04/2016  . Environmental allergies 05/04/2016  . Heart palpitations 05/04/2016  . Panic disorder without agoraphobia 05/04/2016  . Mild persistent asthma 05/04/2016  . Sleep disorder 05/04/2016    JKerin Perna PTA 10/06/18 4:07 PM  [place] [address]Phone: [phone]  Fax:  [phone]  Name: Debarah Mccumbers MRN: [id number] Date of Birth: 04/18/1961

## 2018-10-11 ENCOUNTER — Encounter: Payer: BLUE CROSS/BLUE SHIELD | Admitting: Physical Therapy

## 2018-10-12 ENCOUNTER — Ambulatory Visit (INDEPENDENT_AMBULATORY_CARE_PROVIDER_SITE_OTHER): Payer: BLUE CROSS/BLUE SHIELD | Admitting: Family Medicine

## 2018-10-12 ENCOUNTER — Encounter: Payer: Self-pay | Admitting: Family Medicine

## 2018-10-12 VITALS — Ht 60.0 in | Wt 206.0 lb

## 2018-10-12 DIAGNOSIS — M25571 Pain in right ankle and joints of right foot: Secondary | ICD-10-CM

## 2018-10-12 DIAGNOSIS — M25561 Pain in right knee: Secondary | ICD-10-CM

## 2018-10-12 NOTE — Progress Notes (Signed)
Laura Duke is a 57 y.o. female who presents to [facility] today for follow-up right knee and ankle pain.  Patient has had considerable improvement with physical therapy and notes continued symptoms but significant improvement.  She is happy with how things are going and would like to continue physical therapy.  She notes she is feeling much better overall.    ROS:  As above  Exam:  Ht 5' (1.524 m)   Wt 206 lb (93.4 kg)   BMI 40.23 kg/m  General: Well Developed, well nourished, and in no acute distress.  Neuro/Psych: Alert and oriented x3, extra-ocular muscles intact, able to move all 4 extremities, sensation grossly intact. Skin: Warm and dry, no rashes noted.  Respiratory: Not using accessory muscles, speaking in full sentences, trachea midline.  Cardiovascular: Pulses palpable, no extremity edema. Abdomen: Does not appear distended. MSK:  Right knee: Normal-appearing no significant effusion. Mildly tender palpation medial joint line.  Normal knee motion stable ligaments exam.   Right ankle: Normal-appearing normal motion mildly tender lateral malleolus posterior tibialis tendon area.  Stable ligamentous exam intact strength.    Lab and Radiology Results No results found for this or any previous visit (from the past 72 hour(s)). No results found.     Assessment and Plan: 57 y.o. female with significant improvement in ankle and knee pain with physical therapy.  Plan to continue home exercises and physical therapy.  Recheck as needed.    No orders of the defined types were placed in this encounter.  No orders of the defined types were placed in this encounter.   Historical information moved to improve visibility of documentation.  Past Medical History:  Diagnosis Date  . Essential hypertension 12/24/2017  . Mild persistent asthma 05/04/2016  . MTHFR mutation (HCC) 09/08/2018  . Panic disorder without agoraphobia  05/04/2016  . Sleep disorder 05/04/2016   Past Surgical History:  Procedure Laterality Date  . ANAL FISSURECTOMY    . BREAST REDUCTION SURGERY    . LIPOSUCTION     Social History   Tobacco Use  . Smoking status: Unknown If Ever Smoked  . Smokeless tobacco: Never Used  Substance Use Topics  . Alcohol use: Not on file   family history is not on file.  Medications: Current Outpatient Medications  Medication Sig Dispense Refill  . Ascorbic Acid (VITAMIN C) 1000 MG tablet Take by mouth.    . B-Complex-C CAPS Take by mouth.    . budesonide-formoterol (SYMBICORT) 160-4.5 MCG/ACT inhaler Inhale into the lungs.    . Cholecalciferol (VITAMIN D3) 2000 units TABS Take by mouth.    . Coenzyme Q10 (COQ10) 100 MG CAPS Take by mouth.    . diclofenac sodium (VOLTAREN) 1 % GEL Apply 4 g topically 4 (four) times daily. To affected joint. 100 g 11  . GLUTATHIONE PO Take by mouth.    . losartan (COZAAR) 25 MG tablet Take 25 mg by mouth daily.  1  . Nutritional Supplements (DHEA PO) Take by mouth.    . Pregnenolone Micronized POWD by Does not apply route.    . Probiotic Product (PROBIOTIC DAILY PO) Take by mouth.    . Selenium 100 MCG CAPS Take by mouth.    . sertraline (ZOLOFT) 25 MG tablet Take by mouth.    . triamcinolone (NASACORT ALLERGY 24HR) 55 MCG/ACT AERO nasal inhaler Place into the nose.     No current facility-administered medications for this visit.    Allergies  Allergen Reactions  . Atenolol Shortness Of Breath  . Gluten Meal Rash and Swelling  . Lactase Swelling  . Hydrochlorothiazide Other (See Comments)  . Levofloxacin Other (See Comments)    Insomnia  . Metronidazole Nausea And Vomiting  . Morphine And Related Nausea And Vomiting  . Sertraline Other (See Comments)    Muscle Weakness  . Venlafaxine Other (See Comments) and Swelling    Joint pain  . Casein Other (See Comments) and Rash  . Penicillins Rash and Hives      Discussed warning signs or symptoms. Please see  discharge instructions. Patient expresses understanding.

## 2018-10-12 NOTE — Patient Instructions (Addendum)
Thank you for coming in today. Continue physical therapy and home exercises.   Recheck as needed.   Return sooner if needed.

## 2018-10-13 ENCOUNTER — Encounter: Payer: BLUE CROSS/BLUE SHIELD | Admitting: Physical Therapy

## 2018-10-13 ENCOUNTER — Encounter: Payer: Self-pay | Admitting: Physical Therapy

## 2018-10-13 NOTE — Therapy (Signed)
[facility] [address] [place], [place], [address] Phone: [phone]   Fax:  [phone]  Patient Details  Name: Laura OchsDeidra Duke MRN: [id number] Date of Birth: 1961/06/07 Referring Provider:  No ref. provider found  Encounter Date: 10/13/2018  I strip of sensitive skin rock tape applied with 20% stretch over Rt pes anserine, perpendicular strip applied on top with 50% stretch;  I strip of sensitive Rock tape applied to Rt fibularis muscle group with perpendicular strip just above lateral mallleoli    Tape applied by Cendant CorporationJenn Carlson-Long, PTA.  No charge for visit as pt arrived 40 min late and only requested taping.    Clarita CraneStephanie F Briscoe Daniello, PT, DPT 10/13/18 3:36 PM     [facility] [address] [place], [place], [address] Phone: [phone]   Fax:  [phone]

## 2018-10-20 ENCOUNTER — Ambulatory Visit (INDEPENDENT_AMBULATORY_CARE_PROVIDER_SITE_OTHER): Payer: BLUE CROSS/BLUE SHIELD | Admitting: Rehabilitative and Restorative Service Providers"

## 2018-10-20 ENCOUNTER — Encounter: Payer: Self-pay | Admitting: Rehabilitative and Restorative Service Providers"

## 2018-10-20 DIAGNOSIS — M25571 Pain in right ankle and joints of right foot: Secondary | ICD-10-CM | POA: Diagnosis not present

## 2018-10-20 DIAGNOSIS — G8929 Other chronic pain: Secondary | ICD-10-CM

## 2018-10-20 DIAGNOSIS — M25561 Pain in right knee: Secondary | ICD-10-CM | POA: Diagnosis not present

## 2018-10-20 DIAGNOSIS — Z9181 History of falling: Secondary | ICD-10-CM | POA: Diagnosis not present

## 2018-10-20 DIAGNOSIS — R2689 Other abnormalities of gait and mobility: Secondary | ICD-10-CM

## 2018-10-20 NOTE — Therapy (Signed)
[facility] [place] [address] [place], [place], [address] Phone: [phone]   Fax:  [phone]  Physical Therapy Treatment  Patient Details  Name: Laura Duke MRN: [id number] Date of Birth: July 21, 1961 Referring Provider (PT): Rodolph Bong, MD   Encounter Date: 10/20/2018  PT End of Session - 10/20/18 1450    Visit Number  8    Number of Visits  12    Date for PT Re-Evaluation  10/27/18    Authorization Type  30 visit limit    PT Start Time  1445    PT Stop Time  1530    PT Time Calculation (min)  45 min    Activity Tolerance  Patient tolerated treatment well       Past Medical History:  Diagnosis Date  . Essential hypertension 12/24/2017  . Mild persistent asthma 05/04/2016  . MTHFR mutation (HCC) 09/08/2018  . Panic disorder without agoraphobia 05/04/2016  . Sleep disorder 05/04/2016    Past Surgical History:  Procedure Laterality Date  . ANAL FISSURECTOMY    . BREAST REDUCTION SURGERY    . LIPOSUCTION      There were no vitals filed for this visit.  Subjective Assessment - 10/20/18 1450    Subjective  Missed PT session then forgot to do her exercises and overdid it at work - now knee is angry and "does not want to work". Started feeling better when she started doing her exercises again.     Currently in Pain?  Yes    Pain Score  3     Pain Location  Knee    Pain Orientation  Right    Pain Descriptors / Indicators  Aching    Pain Type  Chronic pain;Acute pain    Pain Onset  More than a month ago    Pain Frequency  Intermittent    Pain Score  3    Pain Location  Heel    Pain Orientation  Right;Left    Pain Descriptors / Indicators  Aching;Tender    Pain Type  Chronic pain;Acute pain    Pain Onset  More than a month ago    Pain Frequency  Intermittent                       [facility] Adult PT Treatment/Exercise - 10/20/18 0001      Knee/Hip Exercises: Stretches   Passive Hamstring Stretch   Right;Left;2 reps;30 seconds   supine with strap    ITB Stretch  Right;2 reps;30 seconds   supine with strap    Gastroc Stretch  Both;2 reps;60 seconds   slant board, level 3   Soleus Stretch  Both;2 reps;30 seconds   slant board, level 2   Other Knee/Hip Stretches  supine adductor stretch x 30 sec x 2 reps (supine with strap) - each leg     Other Knee/Hip Stretches  Rt fibularis muscle stretch (prior to ITB stretch), in supine x 30 sec x 2      Knee/Hip Exercises: Supine   Bridges  Strengthening;Both;10 reps   5 second hold    Straight Leg Raise with External Rotation  Right;10 reps;2 sets    Other Supine Knee/Hip Exercises  clam holding one LE still moving opposite alternating LE's x 10 each side x 2 sets green TB       Knee/Hip Exercises: Sidelying   Hip ABduction  Strengthening;Right;10 reps;2 sets      Manual Therapy  Manual Therapy  Taping    Manual therapy comments  I strip of sensitive skin rock tape applied with 20% stretch over Rt pes anserine, perpendicular strip applied on top with 50% stretch;   reg rock tape applied to Lt plantar fascia and perpendicular strip applied to plantar fascia;  I strip of sensitive Rock tape applied to Rt fibularis muscle group with perpendicular strips just above lateral mallleoli                PT Short Term Goals - 09/29/18 1615      PT SHORT TERM GOAL #1   Title  formal balance testing with LTGs to follow (DGI, BERG)    Status  Achieved        PT Long Term Goals - 10/06/18 1502      PT LONG TERM GOAL #1   Title  independent with HEP    Status  On-going      PT LONG TERM GOAL #2   Title  report ability to walk > 20 min without increase in pain for improved function    Baseline  10/06/18: 10 min     Status  On-going      PT LONG TERM GOAL #3   Title  FOTO score improved to </= 45% limited for improved function    Status  On-going      PT LONG TERM GOAL #4   Title  RLE strength improved to at least 4+/5 all motions  for improved function and stability    Status  Achieved            Plan - 10/20/18 1450    Clinical Impression Statement  Conmtinues up and down course with ankle and knee pain. Responds well to exercise and taping. Added exercises without difficulty. Will benefit form continued treatment to achieve goals of rehab.     Rehab Potential  Good    PT Frequency  2x / week    PT Duration  6 weeks    PT Treatment/Interventions  ADLs/Self Care Home Management;Cryotherapy;Electrical Stimulation;Ultrasound;Moist Heat;Iontophoresis 4mg /ml Dexamethasone;Gait training;Stair training;Functional mobility training;Therapeutic activities;Therapeutic exercise;Patient/family education;Balance training;Neuromuscular re-education;Manual techniques;Taping;Dry needling;Passive range of motion    PT Next Visit Plan  continue progressive LE strengthening and stretching to build walking tolerance.      Consulted and Agree with Plan of Care  Patient       Patient will benefit from skilled therapeutic intervention in order to improve the following deficits and impairments:  Abnormal gait, Increased fascial restricitons, Increased muscle spasms, Pain, Decreased mobility, Decreased balance, Decreased strength, Difficulty walking  Visit Diagnosis: Chronic pain of right knee  Other abnormalities of gait and mobility  History of falling  Pain in right ankle and joints of right foot     Problem List Patient Active Problem List   Diagnosis Date Noted  . MTHFR mutation (HCC) 09/08/2018  . Essential hypertension 12/24/2017  . Allergic contact dermatitis 05/04/2016  . Dermatophytosis 05/04/2016  . Environmental allergies 05/04/2016  . Heart palpitations 05/04/2016  . Panic disorder without agoraphobia 05/04/2016  . Mild persistent asthma 05/04/2016  . Sleep disorder 05/04/2016    Rachele Lamaster Rober MinionP Margalit Leece PT, MPH  10/20/2018, 3:41 PM  [facility] [address] [place] [address] [place], [place], [address] Phone: [phone]   Fax:  [phone]  Name: Laura OchsDeidra Duke MRN: [id number] Date of Birth: 02/01/1961

## 2018-10-25 ENCOUNTER — Ambulatory Visit (INDEPENDENT_AMBULATORY_CARE_PROVIDER_SITE_OTHER): Payer: BLUE CROSS/BLUE SHIELD | Admitting: Physical Therapy

## 2018-10-25 ENCOUNTER — Encounter: Payer: Self-pay | Admitting: Physical Therapy

## 2018-10-25 DIAGNOSIS — M25571 Pain in right ankle and joints of right foot: Secondary | ICD-10-CM

## 2018-10-25 DIAGNOSIS — R2689 Other abnormalities of gait and mobility: Secondary | ICD-10-CM

## 2018-10-25 DIAGNOSIS — M25561 Pain in right knee: Secondary | ICD-10-CM

## 2018-10-25 DIAGNOSIS — G8929 Other chronic pain: Secondary | ICD-10-CM

## 2018-10-25 DIAGNOSIS — Z9181 History of falling: Secondary | ICD-10-CM | POA: Diagnosis not present

## 2018-10-25 NOTE — Therapy (Signed)
[facility] [place] [address] [place], [place], [address] Phone: [phone]   Fax:  [phone]  Physical Therapy Treatment  Patient Details  Name: Laura Duke MRN: [id number] Date of Birth: [birth year] Referring Provider (PT): Rodolph Bong, MD   Encounter Date: 10/25/2018  PT End of Session - 10/25/18 1459    Visit Number  9    Number of Visits  12    Date for PT Re-Evaluation  10/27/18    Authorization Type  30 visit limit    PT Start Time  1456   pt arrived late   PT Stop Time  1527    PT Time Calculation (min)  31 min    Activity Tolerance  Patient tolerated treatment well;No increased pain    Behavior During Therapy  WFL for tasks assessed/performed       Past Medical History:  Diagnosis Date  . Essential hypertension 12/24/2017  . Mild persistent asthma 05/04/2016  . MTHFR mutation (HCC) 09/08/2018  . Panic disorder without agoraphobia 05/04/2016  . Sleep disorder 05/04/2016    Past Surgical History:  Procedure Laterality Date  . ANAL FISSURECTOMY    . BREAST REDUCTION SURGERY    . LIPOSUCTION      There were no vitals filed for this visit.  Subjective Assessment - 10/25/18 1459    Subjective  Pt reports she went out of town this weekend and "probably overdid it".   She tried to work a full schedule with 10 hr day and her LE really suffered. Her mobility scooter arrived today, but she hasn't used it yet. 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     Patient Stated Goals  exercises to help with knee and strengthening ankle    Currently in Pain?  Yes    Pain Score  3     Pain Location  Knee    Pain Orientation  Right;Medial    Pain Descriptors / Indicators  Discomfort    Aggravating Factors   walking too much    Pain Relieving Factors  rest, elevation     Multiple Pain Sites  No         [facility] PT Assessment - 10/25/18 0001      Assessment   Medical Diagnosis  M25.561 (ICD-10-CM) - Acute pain  of right knee; M25.571 (ICD-10-CM) - Acute right ankle pain    Referring Provider (PT)  Rodolph Bong, MD    Next MD Visit  PRN      Strength   Right Hip Extension  4+/5    Right Hip ABduction  5/5    Right Ankle Eversion  5/5       [facility] Adult PT Treatment/Exercise - 10/25/18 0001      Self-Care   Self-Care  Other Self-Care Comments    Other Self-Care Comments   pt educated through demo, of self massage with roller stick to RLE.  pt verbalized understanding.       Knee/Hip Exercises: Stretches   Passive Hamstring Stretch  Right;3 reps;30 seconds    Other Knee/Hip Stretches  supine adductor stretch x 30 sec x 2 reps (supine with strap)      Manual Therapy   Manual therapy comments  I strip of sensitive skin rock tape applied with 20% stretch over Rt pes anserine, perpendicular strip applied on top with 50% stretch;   reg rock tape applied to Lt plantar fascia and perpendicular strip applied to plantar fascia;  I strip of sensitive Rock tape applied to Rt fibularis muscle group with perpendicular strips just above lateral mallleoli     Soft tissue mobilization  STM and IASTM to Rt fibularis muscle group to decrease pain, fascial restrictions and improve mobility; STM to Rt distal adductors prior to tape application      Ankle Exercises: Stretches   Soleus Stretch  2 reps;30 seconds   incline board   Gastroc Stretch  2 reps;30 seconds   incline board     Ankle Exercises: Aerobic   Nustep  L5 x 6 min (UE 8)               PT Short Term Goals - 09/29/18 1615      PT SHORT TERM GOAL #1   Title  formal balance testing with LTGs to follow (DGI, BERG)    Status  Achieved        PT Long Term Goals - 10/25/18 1721      PT LONG TERM GOAL #1   Title  independent with HEP    Status  On-going      PT LONG TERM GOAL #2   Title  report ability to walk > 20 min without increase in pain for improved function    Status  On-going      PT LONG TERM GOAL #3   Title  FOTO score  improved to </= 45% limited for improved function    Status  On-going      PT LONG TERM GOAL #4   Title  RLE strength improved to at least 4+/5 all motions for improved function and stability    Status  Achieved            Plan - 10/25/18 1721    Clinical Impression Statement  Much improved LE strength.  Pt reported significant reduction in RLE pain after manual therapy, including kinesiology taping.  Pt will benefit from instruction on self application for use after d/c.     Rehab Potential  Good    PT Frequency  2x / week    PT Duration  6 weeks    PT Treatment/Interventions  ADLs/Self Care Home Management;Cryotherapy;Electrical Stimulation;Ultrasound;Moist Heat;Iontophoresis 4mg /ml Dexamethasone;Gait training;Stair training;Functional mobility training;Therapeutic activities;Therapeutic exercise;Patient/family education;Balance training;Neuromuscular re-education;Manual techniques;Taping;Dry needling;Passive range of motion    PT Next Visit Plan  end of POC.  assess goals and readiness to d/c.  instruct on self taping.  FOTO.     Consulted and Agree with Plan of Care  Patient       Patient will benefit from skilled therapeutic intervention in order to improve the following deficits and impairments:  Abnormal gait, Increased fascial restricitons, Increased muscle spasms, Pain, Decreased mobility, Decreased balance, Decreased strength, Difficulty walking  Visit Diagnosis: Chronic pain of right knee  Other abnormalities of gait and mobility  History of falling  Pain in right ankle and joints of right foot     Problem List Patient Active Problem List   Diagnosis Date Noted  . MTHFR mutation (HCC) 09/08/2018  . Essential hypertension 12/24/2017  . Allergic contact dermatitis 05/04/2016  . Dermatophytosis 05/04/2016  . Environmental allergies 05/04/2016  . Heart palpitations 05/04/2016  . Panic disorder without agoraphobia 05/04/2016  . Mild persistent asthma 05/04/2016   . Sleep disorder 05/04/2016   Mayer CamelJennifer Carlson-Long, PTA 10/25/18 5:25 PM  [facility] [place] [address] [place], [place], [address] Phone: [phone]   Fax:  [phone]  Name: Laura Duke MRN:  [id number] Date of Birth: [birth year]

## 2018-11-01 ENCOUNTER — Encounter: Payer: Self-pay | Admitting: Physical Therapy

## 2018-11-01 ENCOUNTER — Ambulatory Visit (INDEPENDENT_AMBULATORY_CARE_PROVIDER_SITE_OTHER): Payer: BLUE CROSS/BLUE SHIELD | Admitting: Physical Therapy

## 2018-11-01 DIAGNOSIS — M25571 Pain in right ankle and joints of right foot: Secondary | ICD-10-CM

## 2018-11-01 DIAGNOSIS — G8929 Other chronic pain: Secondary | ICD-10-CM

## 2018-11-01 DIAGNOSIS — R2689 Other abnormalities of gait and mobility: Secondary | ICD-10-CM | POA: Diagnosis not present

## 2018-11-01 DIAGNOSIS — Z9181 History of falling: Secondary | ICD-10-CM | POA: Diagnosis not present

## 2018-11-01 DIAGNOSIS — M25561 Pain in right knee: Secondary | ICD-10-CM

## 2018-11-01 NOTE — Therapy (Signed)
[place], [place], [address] Phone: [phone]   Fax:  [phone]  Physical Therapy Treatment/Discharge  Patient Details  Name: Laura Duke MRN: [id number] Date of Birth: Oct 02, 1961 Referring Provider (PT): Gregor Hams, MD   Encounter Date: 11/01/2018  PT End of Session - 11/01/18 1302    Visit Number  10    Number of Visits  12    Date for PT Re-Evaluation  10/27/18    Authorization Type  30 visit limit    PT Start Time  1201    PT Stop Time  1239    PT Time Calculation (min)  38 min    Activity Tolerance  Patient tolerated treatment well;No increased pain    Behavior During Therapy  WFL for tasks assessed/performed       Past Medical History:  Diagnosis Date  . Essential hypertension 12/24/2017  . Mild persistent asthma 05/04/2016  . MTHFR mutation ([place]) 09/08/2018  . Panic disorder without agoraphobia 05/04/2016  . Sleep disorder 05/04/2016    Past Surgical History:  Procedure Laterality Date  . ANAL FISSURECTOMY    . BREAST REDUCTION SURGERY    . LIPOSUCTION      There were no vitals filed for this visit.  Subjective Assessment - 11/01/18 1206    Subjective  doing well, used BioFreeze and had great improvement in symptoms.      Pertinent History  HTN, panic disorder, depression    Patient Stated Goals  exercises to help with knee and strengthening ankle    Currently in Pain?  No/denies         [facility] PT Assessment - 11/01/18 1300      Assessment   Medical Diagnosis  M25.561 (ICD-10-CM) - Acute pain of right knee; M25.571 (ICD-10-CM) - Acute right ankle pain    Referring Provider (PT)  Gregor Hams, MD      Observation/Other Assessments   Focus on Therapeutic Outcomes (FOTO)   60 (40% limited)                   [facility] Adult PT Treatment/Exercise - 11/01/18 1217      Exercises   Exercises  Knee/Hip;Ankle      Knee/Hip Exercises: Standing   Heel Raises   Both;10 reps      Knee/Hip Exercises: Supine   Straight Leg Raise with External Rotation  Both;10 reps      Knee/Hip Exercises: Sidelying   Hip ABduction  Right;10 reps      Manual Therapy   Manual Therapy  Taping    Manual therapy comments  I strip of sensitive skin rock tape applied with 20% stretch over Rt pes anserine, perpendicular strip applied on top with 50% stretch; educated on how to perform at home    Kinesiotex  Create Space      Ankle Exercises: Stretches   Soleus Stretch  2 reps;30 seconds   incline board   Gastroc Stretch  2 reps;30 seconds   incline board     Ankle Exercises: Seated   Other Seated Ankle Exercises  Rt ankle eversion, inversion and DF with green band x 10; Lt ankle DF with green band x 10              PT Education - 11/01/18 1301    Education Details  taping    Person(s) Educated  Patient    Methods  Explanation    Comprehension  Verbalized understanding  PT Short Term Goals - 09/29/18 1615      PT SHORT TERM GOAL #1   Title  formal balance testing with LTGs to follow (DGI, BERG)    Status  Achieved        PT Long Term Goals - 11/01/18 1302      PT LONG TERM GOAL #1   Title  independent with HEP    Status  Achieved      PT LONG TERM GOAL #2   Title  report ability to walk > 20 min without increase in pain for improved function    Status  Achieved      PT LONG TERM GOAL #3   Title  FOTO score improved to </= 45% limited for improved function    Status  Achieved      PT LONG TERM GOAL #4   Title  RLE strength improved to at least 4+/5 all motions for improved function and stability    Status  Achieved            Plan - 11/01/18 1302    Clinical Impression Statement  Pt has met all goals and is ready for d/c.  Educated in tape and application to perform at home.      Rehab Potential  Good    PT Frequency  2x / week    PT Duration  6 weeks    PT Treatment/Interventions  ADLs/Self Care Home  Management;Cryotherapy;Electrical Stimulation;Ultrasound;Moist Heat;Iontophoresis 58m/ml Dexamethasone;Gait training;Stair training;Functional mobility training;Therapeutic activities;Therapeutic exercise;Patient/family education;Balance training;Neuromuscular re-education;Manual techniques;Taping;Dry needling;Passive range of motion    PT Next Visit Plan  d/c PT    Consulted and Agree with Plan of Care  Patient       Patient will benefit from skilled therapeutic intervention in order to improve the following deficits and impairments:  Abnormal gait, Increased fascial restricitons, Increased muscle spasms, Pain, Decreased mobility, Decreased balance, Decreased strength, Difficulty walking  Visit Diagnosis: Chronic pain of right knee  Other abnormalities of gait and mobility  History of falling  Pain in right ankle and joints of right foot     Problem List Patient Active Problem List   Diagnosis Date Noted  . MTHFR mutation ([place] 09/08/2018  . Essential hypertension 12/24/2017  . Allergic contact dermatitis 05/04/2016  . Dermatophytosis 05/04/2016  . Environmental allergies 05/04/2016  . Heart palpitations 05/04/2016  . Panic disorder without agoraphobia 05/04/2016  . Mild persistent asthma 05/04/2016  . Sleep disorder 05/04/2016      SLaureen Abrahams PT, DPT 11/01/18 1:09 PM     [facility] [address]Phone: [phone]  Fax:  [phone] Name: Laura RobbsMRN: [id number] of Birth: [birth year]     PHYSICAL THERAPY DISCHARGE SUMMARY  Visits from Start of Care: 10  Current functional level related to goals / functional outcomes: See above   Remaining deficits: See above   Education / Equipment: HEP  Plan: Patient agrees to discharge.  Patient goals were met. Patient is being discharged due to meeting the stated rehab goals.  ?????    SLaureen Abrahams PT, DPT 11/01/18 1:09 PM  [facility] [place] [address] [phone](office) [phone](fax)

## 2018-11-03 ENCOUNTER — Encounter: Payer: BLUE CROSS/BLUE SHIELD | Admitting: Rehabilitative and Restorative Service Providers"

## 2018-11-08 ENCOUNTER — Encounter: Payer: BLUE CROSS/BLUE SHIELD | Admitting: Physical Therapy

## 2018-11-10 ENCOUNTER — Encounter: Payer: BLUE CROSS/BLUE SHIELD | Admitting: Physical Therapy

## 2018-11-15 ENCOUNTER — Encounter: Payer: BLUE CROSS/BLUE SHIELD | Admitting: Physical Therapy

## 2018-11-17 ENCOUNTER — Encounter: Payer: BLUE CROSS/BLUE SHIELD | Admitting: Physical Therapy

## 2018-11-22 ENCOUNTER — Encounter: Payer: BLUE CROSS/BLUE SHIELD | Admitting: Physical Therapy

## 2018-11-24 ENCOUNTER — Encounter: Payer: BLUE CROSS/BLUE SHIELD | Admitting: Physical Therapy

## 2018-11-29 ENCOUNTER — Encounter: Payer: BLUE CROSS/BLUE SHIELD | Admitting: Physical Therapy

## 2020-10-04 NOTE — Progress Notes (Deleted)
   Subjective:    I'm seeing this patient as a consultation for:  ***. Note will be routed back to referring provider/PCP.  CC: ***  HPI: ***  Past medical history, Surgical history, Family history, Social history, Allergies, and medications have been entered into the medical record, reviewed. ***  Review of Systems: No new headache, visual changes, nausea, vomiting, diarrhea, constipation, dizziness, abdominal pain, skin rash, fevers, chills, night sweats, weight loss, swollen lymph nodes, body aches, joint swelling, muscle aches, chest pain, shortness of breath, mood changes, visual or auditory hallucinations.   Objective:   There were no vitals filed for this visit. General: Well Developed, well nourished, and in no acute distress.  Neuro/Psych: Alert and oriented x3, extra-ocular muscles intact, able to move all 4 extremities, sensation grossly intact. Skin: Warm and dry, no rashes noted.  Respiratory: Not using accessory muscles, speaking in full sentences, trachea midline.  Cardiovascular: Pulses palpable, no extremity edema. Abdomen: Does not appear distended. MSK: ***  Lab and Radiology Results No results found for this or any previous visit (from the past 72 hour(s)). No results found.  Impression and Recommendations:    Assessment and Plan: 59 y.o. female with ***.  PDMP not reviewed this encounter. No orders of the defined types were placed in this encounter.  No orders of the defined types were placed in this encounter.   Discussed warning signs or symptoms. Please see discharge instructions. Patient expresses understanding.   ***

## 2020-10-07 ENCOUNTER — Ambulatory Visit: Payer: BLUE CROSS/BLUE SHIELD | Admitting: Family Medicine

## 2020-10-10 NOTE — Progress Notes (Signed)
Subjective:    CC: B knee pain  I, Laura Duke, LAT, ATC, am serving as scribe for Dr. Clementeen Graham.  HPI: Pt is a 59 y/o female presenting w/ c/o B knee pain.  She was seen previously by Dr. Denyse Amass on 10/12/18 for her R knee and was advised to con't PT and her HEP.  Since her last visit w/ Dr. Denyse Amass, pt reports fell partially in a 15 ft manhole  2 weeks ago. Pt /o bilat knee pain along the medial aspect. C/o LBPn and L shoulder pn.  She did not fall all the way into the manhole landing up to about her shoulders.  She was able to catch on the partially open manhole cover lead.  The lid pinched her leg and she thinks she hurt her shoulder when she fell and as well. Treatments tried: chiropractic rx x3wk. the chiropractor care has been somewhat helpful however she is done well with physical therapy in the past and is interested in physical therapy if possible.  Diagnostic imaging: R and L knee XR- 09/08/18  Pertinent review of Systems: No fevers or chills  Relevant historical information: Hypertension   Objective:    Vitals:   10/11/20 0932  BP: (!) 144/88  Pulse: 82  SpO2: 96%   General: Well Developed, well nourished, and in no acute distress.   MSK: C-spine normal-appearing nontender midline.  Mildly tender palpation left trapezius. Left shoulder normal-appearing not particularly tender.  Some pain with abduction however range of motion is intact. Strength intact abduction external and internal rotation. Reflexes and cap refill are intact distal bilateral extremities.  L-spine normal-appearing nontender midline.  Tender palpation left lumbar paraspinal musculature.  Decreased lumbar motion to rotation otherwise normal. Lower extremity strength is intact.  Right knee normal-appearing  normal motion.   Mildly tender palpation medial joint line. Stable ligamentous exam. Mildly positive medial McMurray's test. Intact strength.  Left knee normal-appearing Normal  motion. Mildly tender palpation medial joint line. Stable ligamentous exam. Mildly positive Murray's test. Intact strength   Lab and Radiology Results  X-ray images left shoulder L-spine and bilateral knees obtained today personally and independently interpreted  Left shoulder: No fractures.  AC DJD.  No severe glenohumeral DJD.  L-spine: DDD L1-L2 and L2-L3 more significant.  Milder DDD more distally.  No acute fractures.  Mild degenerative scoliosis present.  No other significant or severe malalignment. Left knee: Mild medial compartment narrowing due to DJD.  Patellofemoral DJD mild to moderate.  No fractures visible.  Right knee: Mild medial compartment DJD.  Mild patellofemoral DJD.  No acute fractures visible. . Await formal radiology review  Diagnostic Limited MSK Ultrasound of: Left shoulder Biceps tendon intact normal-appearing Subscapularis tendon appears to be intact. Supraspinatus tendon intact with hypoechoic fluid tracking superficial to tendon consistent with subacromial bursitis. Infraspinatus tendon is intact. AC joint narrowed with effusion Impression: Subacromial bursitis without significant full-thickness retracted rotator cuff tear.  AC DJD.  Diagnostic Limited MSK Ultrasound of: Bilateral knees Dominant finding bilateral knee ultrasound is medial compartment DJD with narrowed and degenerative appearing medial meniscus.  Left knee medial meniscus is partially extruded.  This indicates bilateral medial meniscus tear likely chronic left worse than right.  No other dominant findings present. 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 female with left shoulder pain low back pain and bilateral knee pain after  partially falling into a manhole.  Fortunately she did not seem to suffer severe injuries but did effectively got banged up.  Left  shoulder: Strain of rotator cuff and supporting structures.  Doubtful for full-thickness or severe tear.  Plan for physical therapy and recheck back in 6 or 8 weeks sooner if needed.  Lumbar spine: Again lumbosacral strain.  Does have existing degenerative changes on lumbar spine.  Already receiving ongoing chiropractic care.  Physical therapy should help augment this.  Bilateral knee pain due to contusion and strain and probable exacerbation of DJD.  May have developed new meniscus tear at medial meniscus based on ultrasound.  Again physical therapy should be helpful.  Consider steroid injection in future if needed.  PDMP not reviewed this encounter. Orders Placed This Encounter  Procedures  . [place] LIMITED JOINT SPACE STRUCTURES LOW BILAT(NO LINKED CHARGES)    Standing Status:   Future    Number of Occurrences:   1    Standing Expiration Date:   04/10/2021    Order Specific Question:   Reason for Exam (SYMPTOM  OR DIAGNOSIS REQUIRED)    Answer:   chronic bilateral knee pain    Order Specific Question:   Preferred imaging location?    Answer:   [facility]  . DG Shoulder Left    Standing Status:   Future    Number of Occurrences:   1    Standing Expiration Date:   10/11/2021    Order Specific Question:   Reason for Exam (SYMPTOM  OR DIAGNOSIS REQUIRED)    Answer:   eval shoulder pain after fall    Order Specific Question:   Is patient pregnant?    Answer:   No    Order Specific Question:   Preferred imaging location?    Answer:   [facility]  . DG Lumbar Spine 2-3 Views    Standing Status:   Future    Number of Occurrences:   1    Standing Expiration Date:   10/11/2021    Order Specific Question:   Reason for Exam (SYMPTOM  OR DIAGNOSIS REQUIRED)    Answer:   eval spine pain after fall    Order Specific Question:   Is patient pregnant?    Answer:   No    Order Specific Question:   Preferred imaging location?    Answer:   [facility]  . DG  Knee AP/LAT W/Sunrise Right    Standing Status:   Future    Number of Occurrences:   1    Standing Expiration Date:   10/11/2021    Order Specific Question:   Reason for Exam (SYMPTOM  OR DIAGNOSIS REQUIRED)    Answer:   eval knee pain after fall    Order Specific Question:   Is patient pregnant?    Answer:   No    Order Specific Question:   Preferred imaging location?    Answer:   [facility]  . DG Knee AP/LAT W/Sunrise Left    Standing Status:   Future    Number of Occurrences:   1    Standing Expiration Date:   10/11/2021    Order Specific Question:   Reason for Exam (SYMPTOM  OR DIAGNOSIS REQUIRED)    Answer:   eval knee after fall    Order Specific Question:   Is patient pregnant?    Answer:   No    Order Specific Question:   Preferred  imaging location?    Answer:   [facility]  . Ambulatory referral to Physical Therapy    Referral Priority:   Routine    Referral Type:   Physical Medicine    Referral Reason:   Specialty Services Required    Requested Specialty:   Physical Therapy   No orders of the defined types were placed in this encounter.   Discussed warning signs or symptoms. Please see discharge instructions. Patient expresses understanding.   The above documentation has been reviewed and is accurate and complete Clementeen Graham, M.D.

## 2020-10-11 ENCOUNTER — Other Ambulatory Visit: Payer: Self-pay

## 2020-10-11 ENCOUNTER — Ambulatory Visit (INDEPENDENT_AMBULATORY_CARE_PROVIDER_SITE_OTHER): Payer: 59

## 2020-10-11 ENCOUNTER — Ambulatory Visit: Payer: Self-pay

## 2020-10-11 ENCOUNTER — Ambulatory Visit (INDEPENDENT_AMBULATORY_CARE_PROVIDER_SITE_OTHER): Payer: 59 | Admitting: Family Medicine

## 2020-10-11 ENCOUNTER — Encounter: Payer: Self-pay | Admitting: Family Medicine

## 2020-10-11 VITALS — BP 144/88 | HR 82 | Ht 60.0 in | Wt 208.0 lb

## 2020-10-11 DIAGNOSIS — M545 Low back pain, unspecified: Secondary | ICD-10-CM

## 2020-10-11 DIAGNOSIS — W19XXXA Unspecified fall, initial encounter: Secondary | ICD-10-CM

## 2020-10-11 DIAGNOSIS — G8929 Other chronic pain: Secondary | ICD-10-CM

## 2020-10-11 DIAGNOSIS — M25562 Pain in left knee: Secondary | ICD-10-CM | POA: Diagnosis not present

## 2020-10-11 DIAGNOSIS — M25561 Pain in right knee: Secondary | ICD-10-CM | POA: Diagnosis not present

## 2020-10-11 DIAGNOSIS — M25512 Pain in left shoulder: Secondary | ICD-10-CM

## 2020-10-11 NOTE — Patient Instructions (Signed)
Thank you for coming in today.  Plan for PT in addition to some of your chiropracter care.   Please get an Xray today before you leave  Keep me updated especially if not better.   Next recheck would be in 6 weeks if needed.   Let me know sooner if things are not working out.

## 2020-10-14 NOTE — Progress Notes (Signed)
X-ray left shoulder looks normal to radiology

## 2020-10-14 NOTE — Progress Notes (Signed)
X-ray lumbar spine shows arthritis at L1-2 and L2-3.  No acute fractures.  Plan for physical therapy.

## 2020-10-14 NOTE — Progress Notes (Signed)
X-ray right knee looks normal to radiology

## 2020-10-14 NOTE — Progress Notes (Signed)
X-ray left knee showed a possible calcified loose body and some mild arthritis changes.  No acute fractures visible.

## 2020-11-26 NOTE — Progress Notes (Deleted)
   I, Philbert Riser, LAT, ATC acting as a scribe for Clementeen Graham, MD.  Laura Duke is a 59 y.o. female who presents to [facility] today for f/u chronic bilat knee pain, LBPn, and L shoulder pain after falling in a manhole in early Nov. Pt was last seen by Dr. Denyse Amass on 10/11/20 and was referred for PT of which she has not completed any visits. Today, pt reports//.    Aggravates: Rx tried: chiro, PT  Dx imaging: 10/11/20 R& L knee XR  10/11/20 L-spine XR  10/11/20 L shoulder XR  09/08/18 R and L knee XR  Pertinent review of systems: ***  Relevant historical information: ***   Exam:  There were no vitals taken for this visit. General: Well Developed, well nourished, and in no acute distress.   MSK: ***    Lab and Radiology Results No results found for this or any previous visit (from the past 72 hour(s)). No results found.     Assessment and Plan: 59 y.o. female with ***   PDMP not reviewed this encounter. No orders of the defined types were placed in this encounter.  No orders of the defined types were placed in this encounter.    Discussed warning signs or symptoms. Please see discharge instructions. Patient expresses understanding.   ***

## 2020-11-27 ENCOUNTER — Ambulatory Visit: Payer: 59 | Admitting: Family Medicine

## 2021-04-20 IMAGING — DX DG SHOULDER 2+V*L*
3 series · 3 of 3 positions shown · non-contrast
Comparison: None.

CLINICAL DATA: Fall

EXAM:
LEFT SHOULDER - 2+ VIEW

[shoulder ap (1 of 2)]
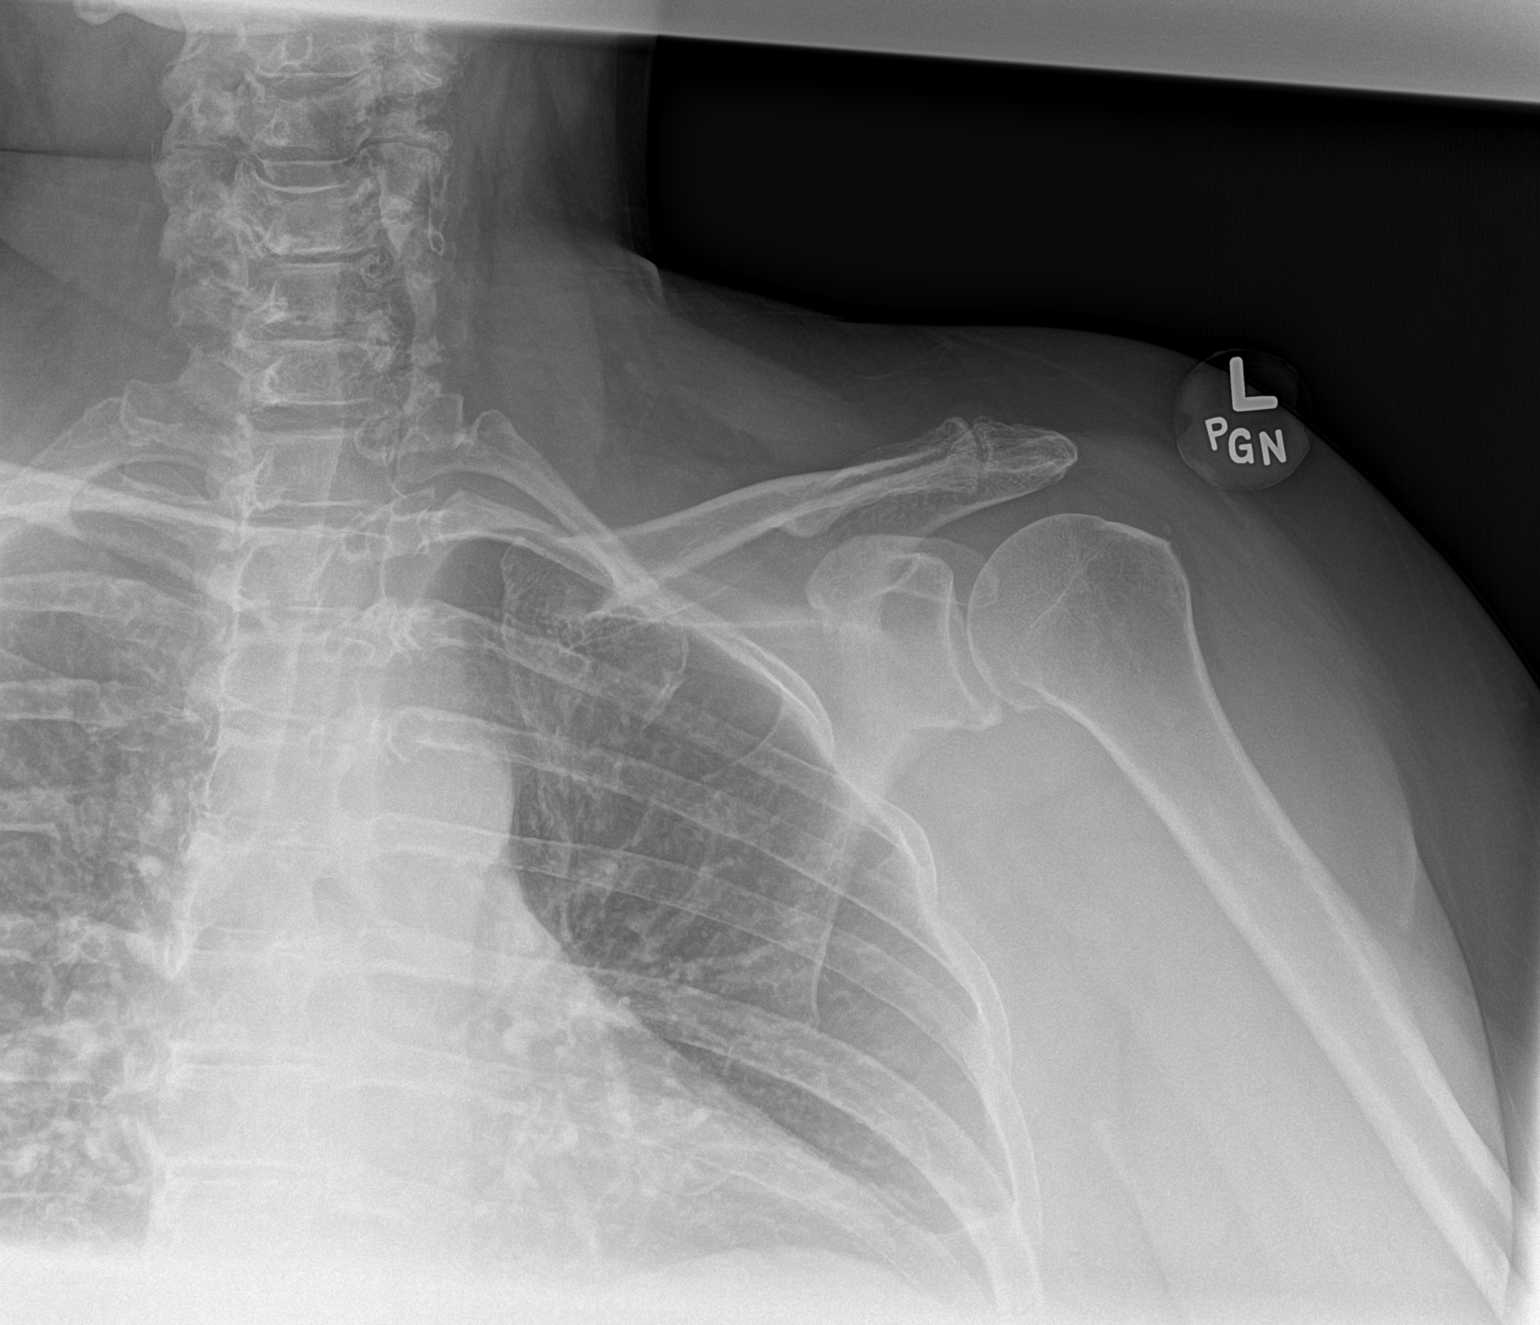

[shoulder ap (2 of 2)]
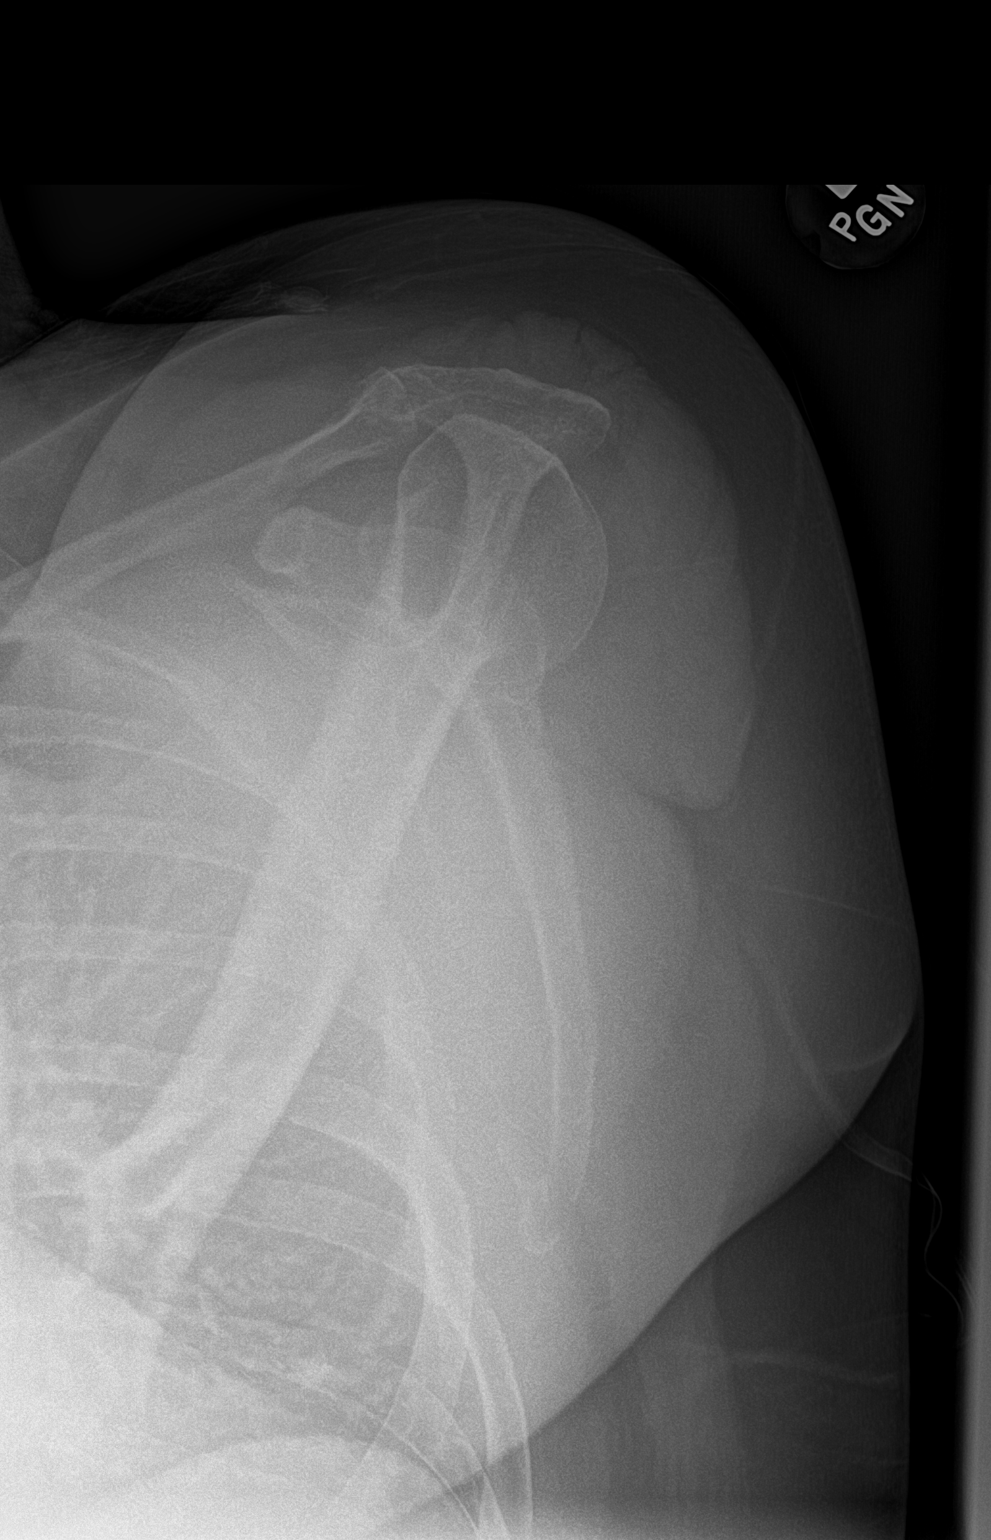

[shoulder axial]
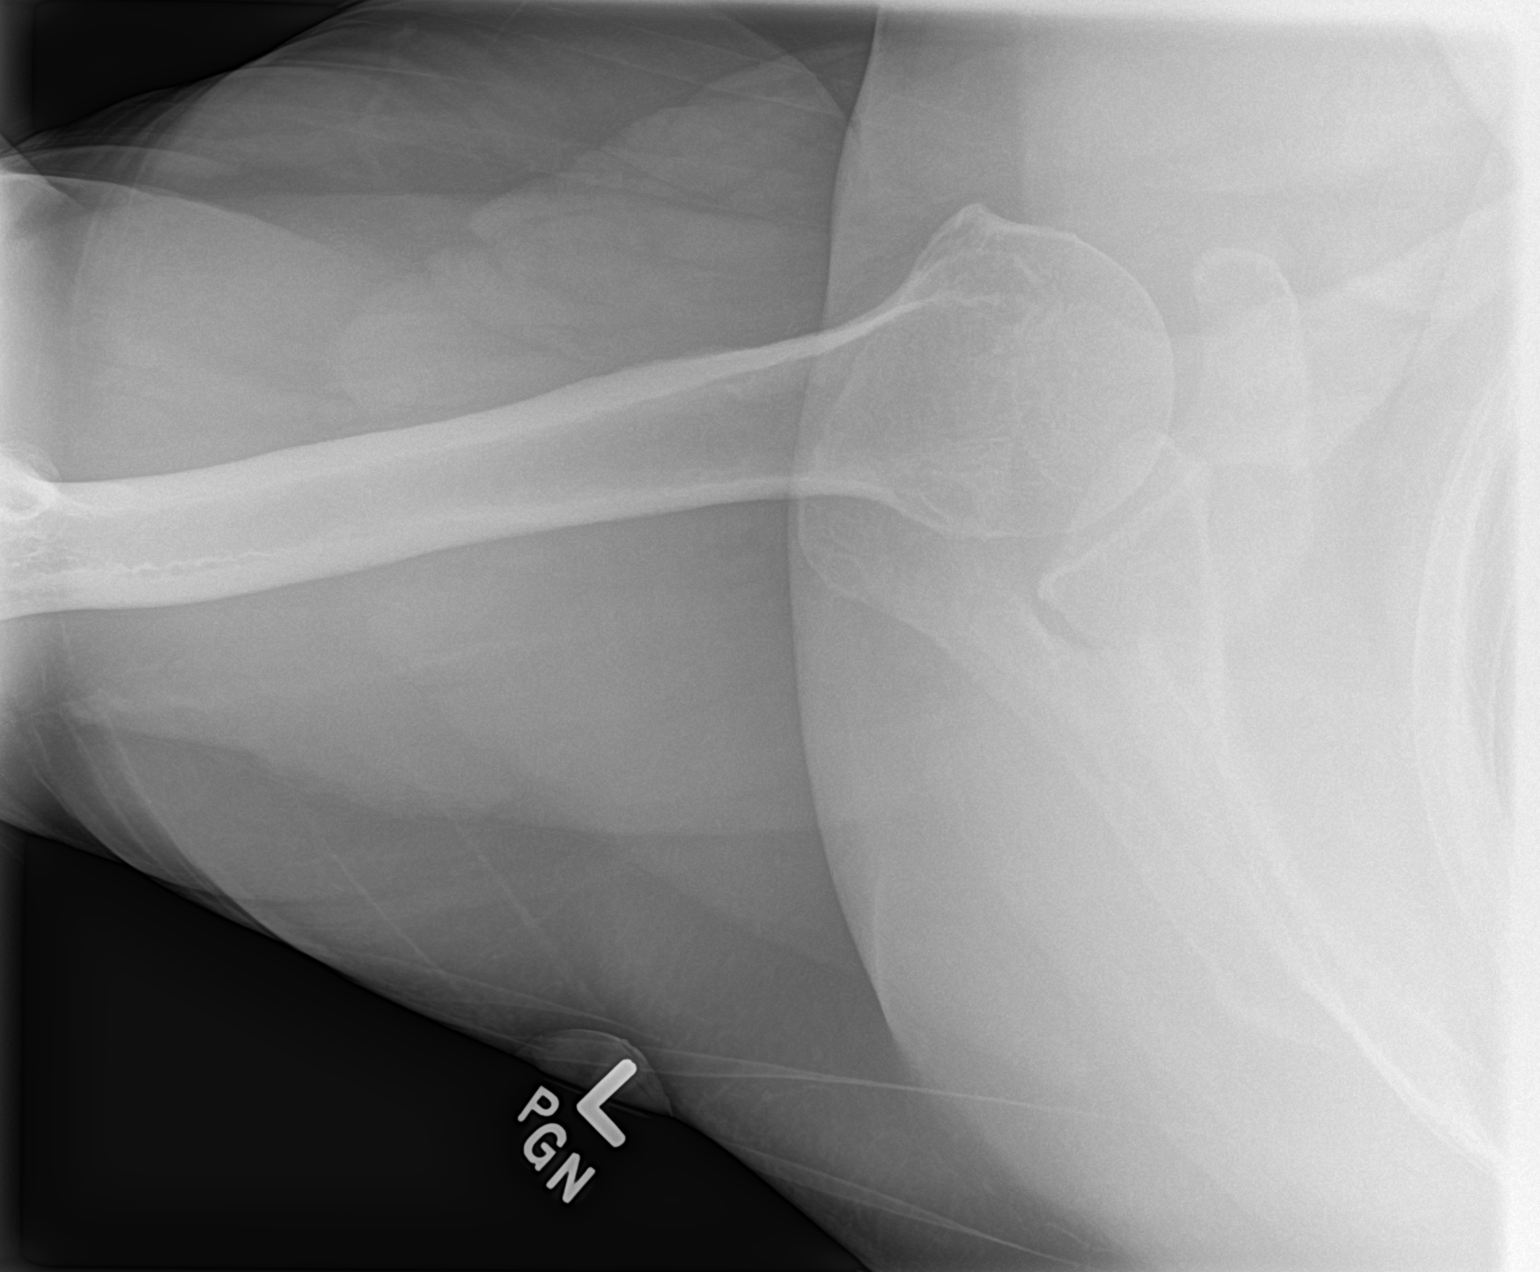

[3 of 3 positions shown; findings below may reference images not displayed]

FINDINGS: There is no evidence of fracture or dislocation. There is no
evidence of arthropathy or other focal bone abnormality. Soft
tissues are unremarkable.
IMPRESSION: Negative.
